# Patient Record
Sex: Female | Born: 1935 | Race: White | Hispanic: No | Smoking: Never smoker
Health system: Southern US, Community
[De-identification: ages and names within clinical notes are randomized; demographics above are authoritative.]

## PROBLEM LIST (undated history)

## (undated) DIAGNOSIS — I509 Heart failure, unspecified: Secondary | ICD-10-CM

## (undated) DIAGNOSIS — D649 Anemia, unspecified: Secondary | ICD-10-CM

## (undated) DIAGNOSIS — E785 Hyperlipidemia, unspecified: Secondary | ICD-10-CM

## (undated) DIAGNOSIS — T827XXA Infection and inflammatory reaction due to other cardiac and vascular devices, implants and grafts, initial encounter: Secondary | ICD-10-CM

## (undated) DIAGNOSIS — I4891 Unspecified atrial fibrillation: Secondary | ICD-10-CM

## (undated) DIAGNOSIS — M1990 Unspecified osteoarthritis, unspecified site: Secondary | ICD-10-CM

## (undated) DIAGNOSIS — F32A Depression, unspecified: Secondary | ICD-10-CM

## (undated) DIAGNOSIS — G709 Myoneural disorder, unspecified: Secondary | ICD-10-CM

## (undated) DIAGNOSIS — B852 Pediculosis, unspecified: Secondary | ICD-10-CM

## (undated) DIAGNOSIS — E2740 Unspecified adrenocortical insufficiency: Secondary | ICD-10-CM

## (undated) DIAGNOSIS — J189 Pneumonia, unspecified organism: Secondary | ICD-10-CM

## (undated) DIAGNOSIS — F329 Major depressive disorder, single episode, unspecified: Secondary | ICD-10-CM

## (undated) DIAGNOSIS — K219 Gastro-esophageal reflux disease without esophagitis: Secondary | ICD-10-CM

## (undated) DIAGNOSIS — I10 Essential (primary) hypertension: Secondary | ICD-10-CM

## (undated) DIAGNOSIS — N2581 Secondary hyperparathyroidism of renal origin: Secondary | ICD-10-CM

## (undated) DIAGNOSIS — C801 Malignant (primary) neoplasm, unspecified: Secondary | ICD-10-CM

## (undated) DIAGNOSIS — Z8679 Personal history of other diseases of the circulatory system: Secondary | ICD-10-CM

## (undated) DIAGNOSIS — N186 End stage renal disease: Secondary | ICD-10-CM

## (undated) HISTORY — PX: WRIST SURGERY: SHX841

## (undated) HISTORY — DX: Hyperlipidemia, unspecified: E78.5

## (undated) HISTORY — DX: Secondary hyperparathyroidism of renal origin: N25.81

## (undated) HISTORY — DX: Pediculosis, unspecified: B85.2

## (undated) HISTORY — DX: End stage renal disease: N18.6

## (undated) HISTORY — PX: ARTERIOVENOUS GRAFT PLACEMENT: SUR1029

## (undated) HISTORY — DX: Essential (primary) hypertension: I10

## (undated) HISTORY — DX: Unspecified atrial fibrillation: I48.91

## (undated) HISTORY — DX: Anemia, unspecified: D64.9

## (undated) HISTORY — DX: Heart failure, unspecified: I50.9

## (undated) HISTORY — DX: Personal history of other diseases of the circulatory system: Z86.79

## (undated) HISTORY — DX: Unspecified adrenocortical insufficiency: E27.40

## (undated) HISTORY — DX: Malignant (primary) neoplasm, unspecified: C80.1

---

## 2005-10-14 ENCOUNTER — Ambulatory Visit: Payer: Self-pay | Admitting: Oncology

## 2005-12-16 ENCOUNTER — Ambulatory Visit: Payer: Self-pay | Admitting: Oncology

## 2006-02-17 ENCOUNTER — Ambulatory Visit: Payer: Self-pay | Admitting: Oncology

## 2006-03-31 ENCOUNTER — Ambulatory Visit: Payer: Self-pay | Admitting: Oncology

## 2006-04-21 ENCOUNTER — Ambulatory Visit: Payer: Self-pay | Admitting: Oncology

## 2006-06-09 ENCOUNTER — Ambulatory Visit: Payer: Self-pay | Admitting: Oncology

## 2006-08-11 ENCOUNTER — Ambulatory Visit: Payer: Self-pay | Admitting: Oncology

## 2006-10-06 ENCOUNTER — Ambulatory Visit: Payer: Self-pay | Admitting: Oncology

## 2006-12-16 ENCOUNTER — Ambulatory Visit: Payer: Self-pay | Admitting: Oncology

## 2007-04-24 ENCOUNTER — Ambulatory Visit: Payer: Self-pay | Admitting: Oncology

## 2008-02-25 ENCOUNTER — Ambulatory Visit: Payer: Self-pay | Admitting: *Deleted

## 2008-03-24 ENCOUNTER — Ambulatory Visit (HOSPITAL_COMMUNITY): Admission: RE | Admit: 2008-03-24 | Discharge: 2008-03-24 | Payer: Self-pay | Admitting: Vascular Surgery

## 2008-03-24 ENCOUNTER — Ambulatory Visit: Payer: Self-pay | Admitting: Vascular Surgery

## 2008-05-22 ENCOUNTER — Ambulatory Visit: Payer: Self-pay | Admitting: Internal Medicine

## 2008-05-22 ENCOUNTER — Inpatient Hospital Stay (HOSPITAL_COMMUNITY): Admission: EM | Admit: 2008-05-22 | Discharge: 2008-05-26 | Payer: Self-pay | Admitting: Internal Medicine

## 2008-05-23 ENCOUNTER — Ambulatory Visit: Payer: Self-pay | Admitting: Vascular Surgery

## 2009-07-07 ENCOUNTER — Inpatient Hospital Stay (HOSPITAL_COMMUNITY): Admission: RE | Admit: 2009-07-07 | Discharge: 2009-07-15 | Payer: Self-pay | Admitting: Orthopedic Surgery

## 2010-03-15 ENCOUNTER — Inpatient Hospital Stay (HOSPITAL_COMMUNITY): Admission: EM | Admit: 2010-03-15 | Discharge: 2010-03-20 | Payer: Self-pay | Admitting: Internal Medicine

## 2010-08-09 ENCOUNTER — Ambulatory Visit (HOSPITAL_COMMUNITY)
Admission: RE | Admit: 2010-08-09 | Discharge: 2010-08-09 | Payer: Self-pay | Source: Home / Self Care | Admitting: Vascular Surgery

## 2010-08-09 ENCOUNTER — Ambulatory Visit: Payer: Self-pay | Admitting: Vascular Surgery

## 2010-09-08 IMAGING — US US RENAL
1 series · 14 of 25 positions shown · non-contrast
Comparison: 03/16/2010

CLINICAL DATA: Renal failure.  UTI.

RENAL / URINARY TRACT ULTRASOUND
TECHNIQUE: Complete ultrasound exam of the kidneys and urinary
bladder was performed.

[Series 1: us renal · 0.33mm/px · 14 of 27 slices shown]
[im 1/27]
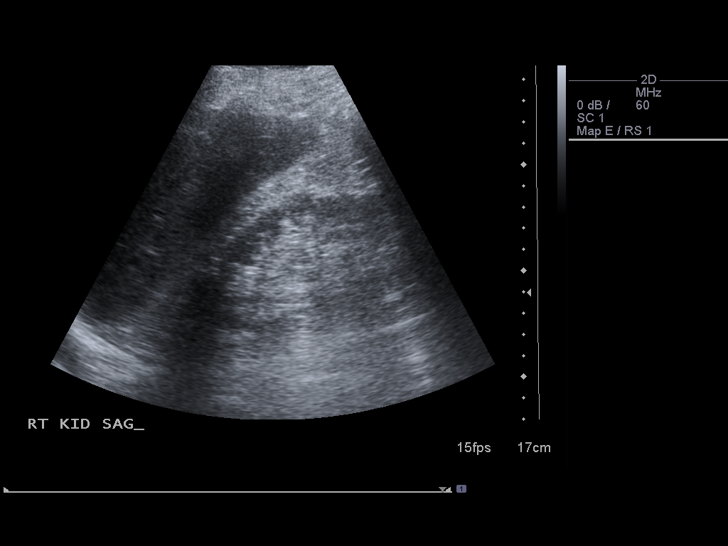
[im 3/27]
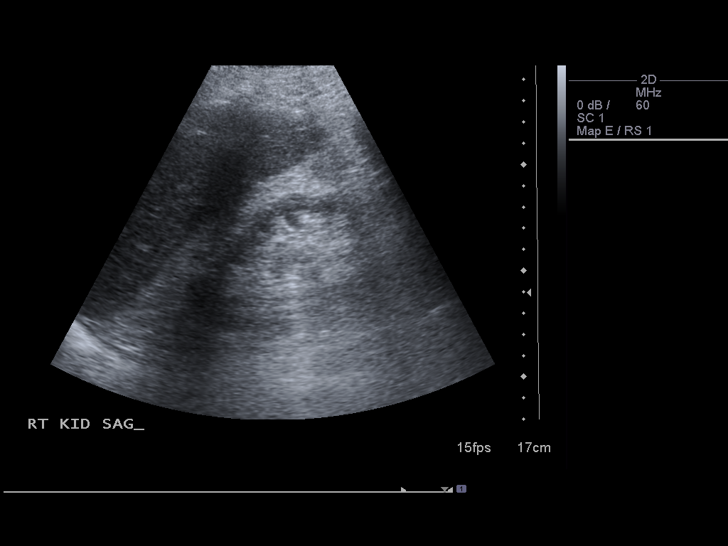
[im 5/27]
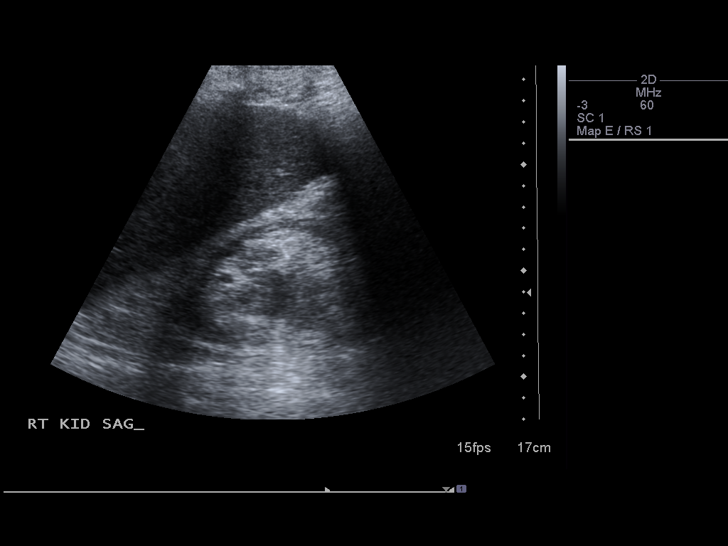
[im 7/27]
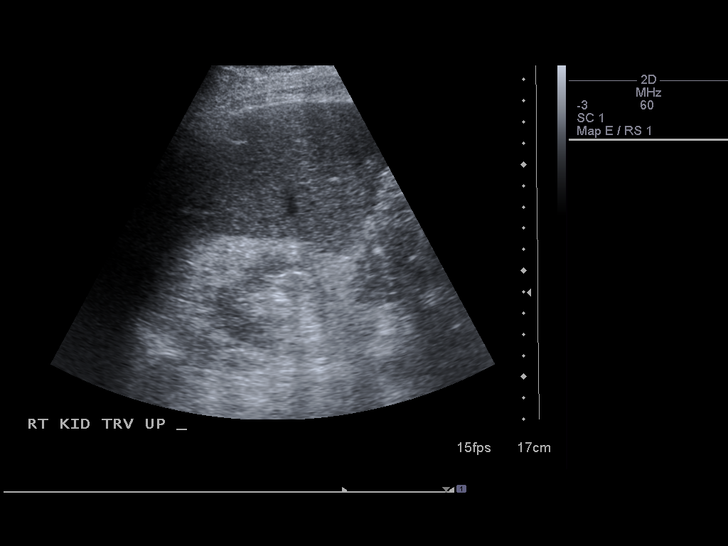
[im 9/27]
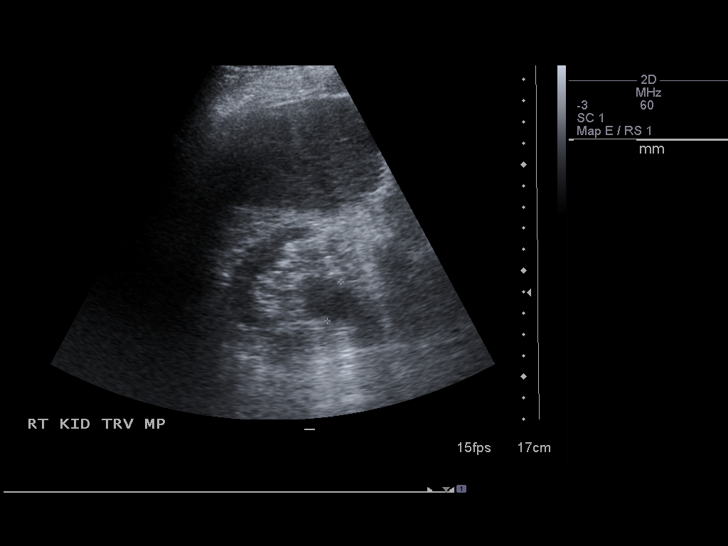
[im 10/27]
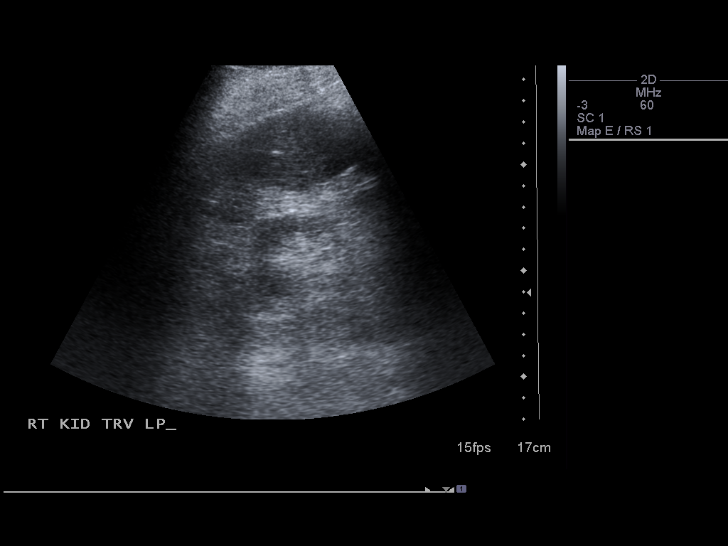
[im 12/27]
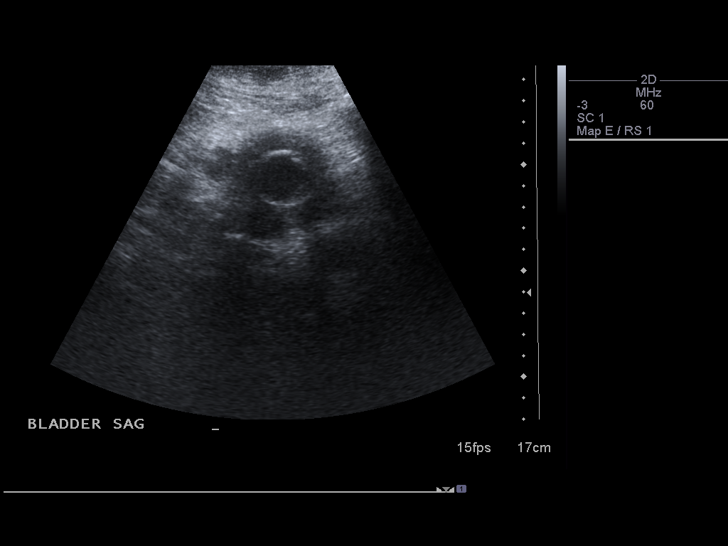
[im 15/27]
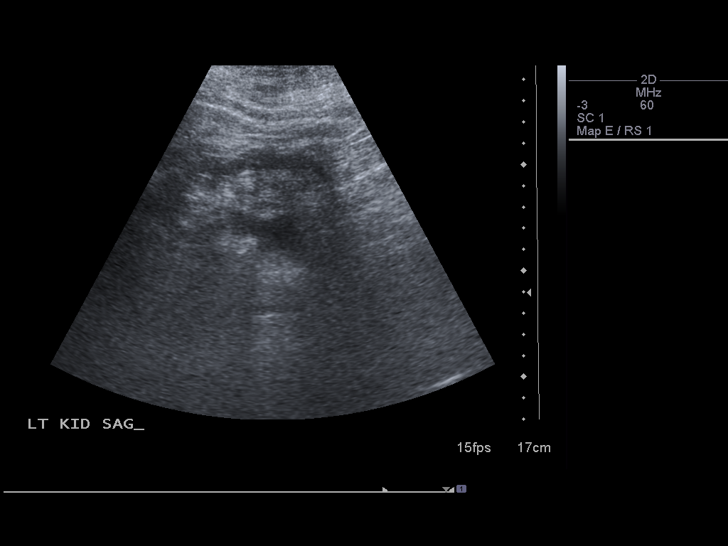
[im 17/27]
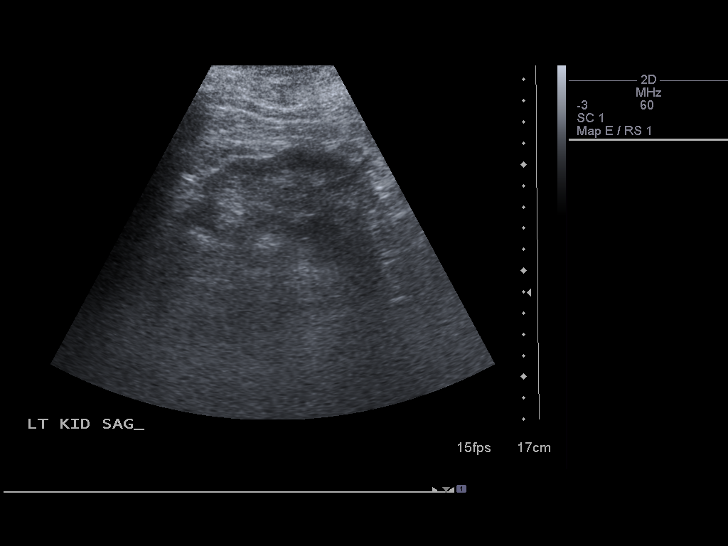
[im 18/27]
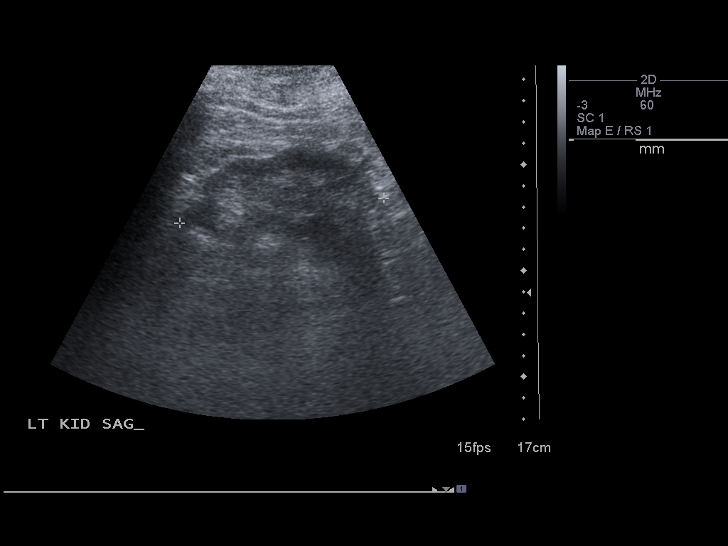
[im 20/27]
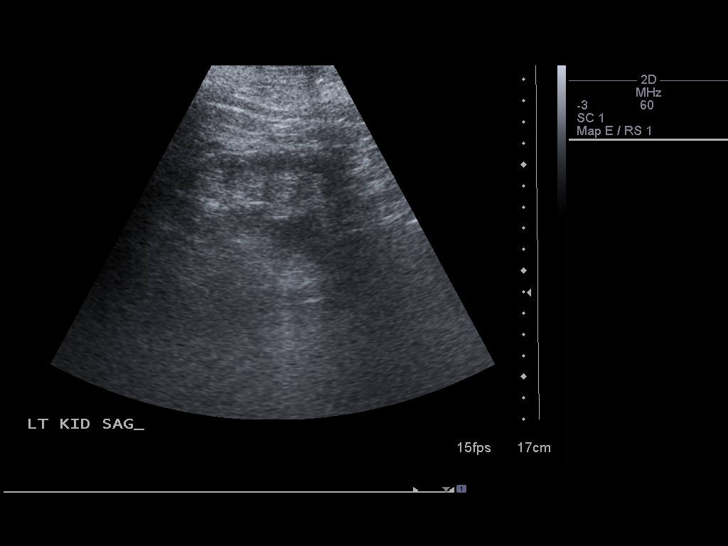
[im 22/27]
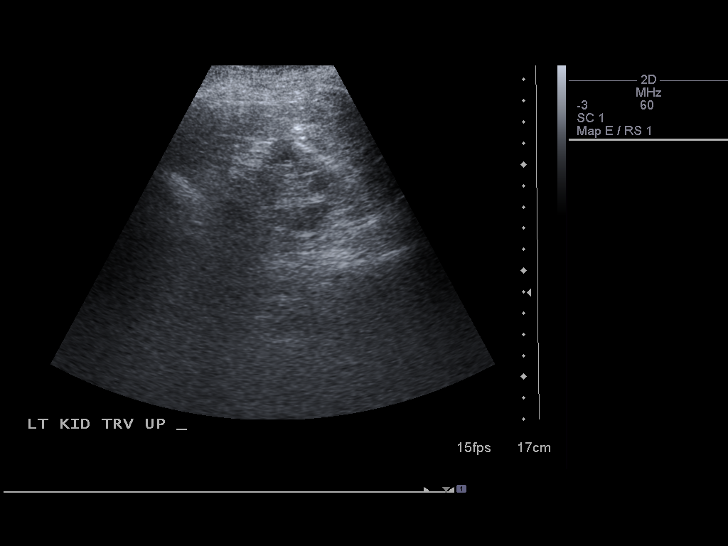
[im 24/27]
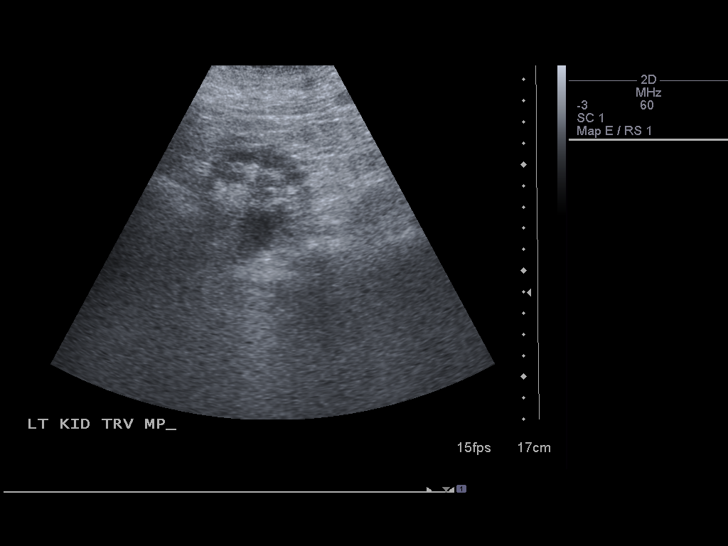
[im 27/27]
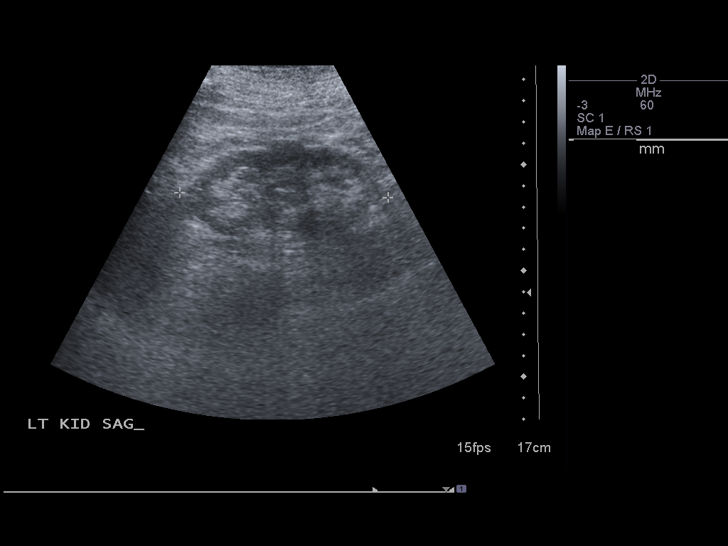

[14 of 25 positions shown; findings below may reference images not displayed]

FINDINGS: The right kidney measures 9.30 cm in long axis.  The left kidney
measures 9.8 cm.  Both kidneys show marked cortical thinning.  The
hydronephrosis seen on the previous study has decreased in the
interval although each renal pelvis remains slightly prominent.

A Foley catheter decompresses the urinary bladder.
IMPRESSION: Interval decrease  in bilateral hydronephrosis.

Bilateral renal cortical thinning.

## 2010-11-20 LAB — GLUCOSE, CAPILLARY: Glucose-Capillary: 145 mg/dL — ABNORMAL HIGH (ref 70–99)

## 2010-11-20 LAB — POCT I-STAT 4, (NA,K, GLUC, HGB,HCT)
Glucose, Bld: 155 mg/dL — ABNORMAL HIGH (ref 70–99)
HCT: 34 % — ABNORMAL LOW (ref 36.0–46.0)
Hemoglobin: 11.6 g/dL — ABNORMAL LOW (ref 12.0–15.0)
Potassium: 4.1 mEq/L (ref 3.5–5.1)

## 2010-11-25 LAB — GLUCOSE, CAPILLARY
Glucose-Capillary: 103 mg/dL — ABNORMAL HIGH (ref 70–99)
Glucose-Capillary: 112 mg/dL — ABNORMAL HIGH (ref 70–99)
Glucose-Capillary: 113 mg/dL — ABNORMAL HIGH (ref 70–99)
Glucose-Capillary: 118 mg/dL — ABNORMAL HIGH (ref 70–99)
Glucose-Capillary: 125 mg/dL — ABNORMAL HIGH (ref 70–99)
Glucose-Capillary: 132 mg/dL — ABNORMAL HIGH (ref 70–99)
Glucose-Capillary: 140 mg/dL — ABNORMAL HIGH (ref 70–99)
Glucose-Capillary: 143 mg/dL — ABNORMAL HIGH (ref 70–99)
Glucose-Capillary: 150 mg/dL — ABNORMAL HIGH (ref 70–99)
Glucose-Capillary: 174 mg/dL — ABNORMAL HIGH (ref 70–99)
Glucose-Capillary: 188 mg/dL — ABNORMAL HIGH (ref 70–99)
Glucose-Capillary: 97 mg/dL (ref 70–99)

## 2010-11-25 LAB — CBC
HCT: 28.8 % — ABNORMAL LOW (ref 36.0–46.0)
HCT: 30.5 % — ABNORMAL LOW (ref 36.0–46.0)
Hemoglobin: 10.3 g/dL — ABNORMAL LOW (ref 12.0–15.0)
Hemoglobin: 9.6 g/dL — ABNORMAL LOW (ref 12.0–15.0)
MCH: 32.7 pg (ref 26.0–34.0)
MCH: 32.7 pg (ref 26.0–34.0)
MCH: 32.8 pg (ref 26.0–34.0)
MCH: 32.8 pg (ref 26.0–34.0)
MCHC: 33.3 g/dL (ref 30.0–36.0)
MCHC: 33.3 g/dL (ref 30.0–36.0)
MCHC: 33.7 g/dL (ref 30.0–36.0)
MCV: 97.3 fL (ref 78.0–100.0)
MCV: 97.9 fL (ref 78.0–100.0)
MCV: 98 fL (ref 78.0–100.0)
MCV: 98.5 fL (ref 78.0–100.0)
Platelets: 170 10*3/uL (ref 150–400)
Platelets: 171 10*3/uL (ref 150–400)
Platelets: 174 10*3/uL (ref 150–400)
Platelets: 220 10*3/uL (ref 150–400)
RBC: 2.92 MIL/uL — ABNORMAL LOW (ref 3.87–5.11)
RBC: 2.93 MIL/uL — ABNORMAL LOW (ref 3.87–5.11)
RBC: 3.14 MIL/uL — ABNORMAL LOW (ref 3.87–5.11)
RDW: 15.5 % (ref 11.5–15.5)
RDW: 15.5 % (ref 11.5–15.5)
RDW: 16 % — ABNORMAL HIGH (ref 11.5–15.5)
WBC: 10.1 10*3/uL (ref 4.0–10.5)
WBC: 6.3 10*3/uL (ref 4.0–10.5)
WBC: 8.8 10*3/uL (ref 4.0–10.5)

## 2010-11-25 LAB — URINE MICROSCOPIC-ADD ON

## 2010-11-25 LAB — RENAL FUNCTION PANEL
BUN: 26 mg/dL — ABNORMAL HIGH (ref 6–23)
BUN: 46 mg/dL — ABNORMAL HIGH (ref 6–23)
CO2: 28 mEq/L (ref 19–32)
Chloride: 96 mEq/L (ref 96–112)
Creatinine, Ser: 3.13 mg/dL — ABNORMAL HIGH (ref 0.4–1.2)
GFR calc non Af Amer: 14 mL/min — ABNORMAL LOW (ref >60)
Glucose, Bld: 116 mg/dL — ABNORMAL HIGH (ref 70–99)
Phosphorus: 4.3 mg/dL (ref 2.3–4.6)
Potassium: 4.2 mEq/L (ref 3.5–5.1)
Potassium: 4.3 mEq/L (ref 3.5–5.1)

## 2010-11-25 LAB — URINALYSIS, ROUTINE W REFLEX MICROSCOPIC
Bilirubin Urine: NEGATIVE
Glucose, UA: NEGATIVE mg/dL
Ketones, ur: 15 mg/dL — AB
Nitrite: POSITIVE — AB
Protein, ur: 100 mg/dL — AB
Specific Gravity, Urine: 1.02 (ref 1.005–1.030)
Urobilinogen, UA: 1 mg/dL (ref 0.0–1.0)
pH: 7 (ref 5.0–8.0)

## 2010-11-25 LAB — HEMOGLOBIN A1C
Hgb A1c MFr Bld: 6.4 % — ABNORMAL HIGH (ref <5.7)
Mean Plasma Glucose: 137 mg/dL — ABNORMAL HIGH (ref <117)

## 2010-11-25 LAB — URINE CULTURE
Colony Count: NO GROWTH
Culture: NO GROWTH

## 2010-11-25 LAB — BASIC METABOLIC PANEL
BUN: 13 mg/dL (ref 6–23)
CO2: 29 mEq/L (ref 19–32)
CO2: 29 mEq/L (ref 19–32)
Calcium: 9 mg/dL (ref 8.4–10.5)
Chloride: 92 mEq/L — ABNORMAL LOW (ref 96–112)
Chloride: 93 mEq/L — ABNORMAL LOW (ref 96–112)
Creatinine, Ser: 2.42 mg/dL — ABNORMAL HIGH (ref 0.4–1.2)
GFR calc Af Amer: 17 mL/min — ABNORMAL LOW (ref >60)
Glucose, Bld: 196 mg/dL — ABNORMAL HIGH (ref 70–99)
Sodium: 131 mEq/L — ABNORMAL LOW (ref 135–145)

## 2010-11-25 LAB — CULTURE, BLOOD (ROUTINE X 2): Culture: NO GROWTH

## 2010-12-12 LAB — CBC
HCT: 30 % — ABNORMAL LOW (ref 36.0–46.0)
HCT: 31.1 % — ABNORMAL LOW (ref 36.0–46.0)
HCT: 33.5 % — ABNORMAL LOW (ref 36.0–46.0)
Hemoglobin: 10.2 g/dL — ABNORMAL LOW (ref 12.0–15.0)
MCHC: 33.5 g/dL (ref 30.0–36.0)
MCHC: 34.1 g/dL (ref 30.0–36.0)
MCV: 100.1 fL — ABNORMAL HIGH (ref 78.0–100.0)
Platelets: 219 10*3/uL (ref 150–400)
Platelets: 230 10*3/uL (ref 150–400)
Platelets: 304 10*3/uL (ref 150–400)
RDW: 13.8 % (ref 11.5–15.5)
RDW: 13.8 % (ref 11.5–15.5)
RDW: 13.9 % (ref 11.5–15.5)
WBC: 6.6 10*3/uL (ref 4.0–10.5)

## 2010-12-12 LAB — COMPREHENSIVE METABOLIC PANEL
AST: 25 U/L (ref 0–37)
Albumin: 2.8 g/dL — ABNORMAL LOW (ref 3.5–5.2)
Alkaline Phosphatase: 86 U/L (ref 39–117)
BUN: 38 mg/dL — ABNORMAL HIGH (ref 6–23)
Chloride: 94 mEq/L — ABNORMAL LOW (ref 96–112)
Creatinine, Ser: 3.68 mg/dL — ABNORMAL HIGH (ref 0.4–1.2)
GFR calc Af Amer: 15 mL/min — ABNORMAL LOW (ref >60)
Potassium: 3.8 mEq/L (ref 3.5–5.1)
Total Bilirubin: 0.5 mg/dL (ref 0.3–1.2)
Total Protein: 6.4 g/dL (ref 6.0–8.3)

## 2010-12-12 LAB — URINE CULTURE
Colony Count: NO GROWTH
Culture: NO GROWTH

## 2010-12-12 LAB — GLUCOSE, CAPILLARY
Glucose-Capillary: 100 mg/dL — ABNORMAL HIGH (ref 70–99)
Glucose-Capillary: 107 mg/dL — ABNORMAL HIGH (ref 70–99)
Glucose-Capillary: 109 mg/dL — ABNORMAL HIGH (ref 70–99)
Glucose-Capillary: 115 mg/dL — ABNORMAL HIGH (ref 70–99)
Glucose-Capillary: 118 mg/dL — ABNORMAL HIGH (ref 70–99)
Glucose-Capillary: 128 mg/dL — ABNORMAL HIGH (ref 70–99)
Glucose-Capillary: 139 mg/dL — ABNORMAL HIGH (ref 70–99)
Glucose-Capillary: 149 mg/dL — ABNORMAL HIGH (ref 70–99)
Glucose-Capillary: 164 mg/dL — ABNORMAL HIGH (ref 70–99)
Glucose-Capillary: 175 mg/dL — ABNORMAL HIGH (ref 70–99)
Glucose-Capillary: 218 mg/dL — ABNORMAL HIGH (ref 70–99)
Glucose-Capillary: 62 mg/dL — ABNORMAL LOW (ref 70–99)
Glucose-Capillary: 68 mg/dL — ABNORMAL LOW (ref 70–99)
Glucose-Capillary: 75 mg/dL (ref 70–99)
Glucose-Capillary: 83 mg/dL (ref 70–99)
Glucose-Capillary: 98 mg/dL (ref 70–99)

## 2010-12-12 LAB — URINALYSIS, ROUTINE W REFLEX MICROSCOPIC
Bilirubin Urine: NEGATIVE
Ketones, ur: NEGATIVE mg/dL
Nitrite: NEGATIVE
Protein, ur: 30 mg/dL — AB
Urobilinogen, UA: 0.2 mg/dL (ref 0.0–1.0)
pH: 6 (ref 5.0–8.0)

## 2010-12-12 LAB — IRON AND TIBC
Saturation Ratios: 9 % — ABNORMAL LOW (ref 20–55)
UIBC: 173 ug/dL

## 2010-12-12 LAB — RENAL FUNCTION PANEL
Albumin: 2.8 g/dL — ABNORMAL LOW (ref 3.5–5.2)
Albumin: 2.9 g/dL — ABNORMAL LOW (ref 3.5–5.2)
BUN: 27 mg/dL — ABNORMAL HIGH (ref 6–23)
BUN: 63 mg/dL — ABNORMAL HIGH (ref 6–23)
Calcium: 8.2 mg/dL — ABNORMAL LOW (ref 8.4–10.5)
Calcium: 8.7 mg/dL (ref 8.4–10.5)
Chloride: 94 mEq/L — ABNORMAL LOW (ref 96–112)
Creatinine, Ser: 3.06 mg/dL — ABNORMAL HIGH (ref 0.4–1.2)
Glucose, Bld: 135 mg/dL — ABNORMAL HIGH (ref 70–99)
Phosphorus: 3.7 mg/dL (ref 2.3–4.6)
Phosphorus: 6 mg/dL — ABNORMAL HIGH (ref 2.3–4.6)
Potassium: 4.2 mEq/L (ref 3.5–5.1)

## 2010-12-12 LAB — TSH: TSH: 5.676 u[IU]/mL — ABNORMAL HIGH (ref 0.350–4.500)

## 2010-12-12 LAB — URINALYSIS, MICROSCOPIC ONLY
Nitrite: NEGATIVE
Protein, ur: 30 mg/dL — AB
Urobilinogen, UA: 0.2 mg/dL (ref 0.0–1.0)

## 2010-12-12 LAB — OSMOLALITY: Osmolality: 288 mOsm/kg (ref 275–300)

## 2010-12-12 LAB — VITAMIN B12: Vitamin B-12: 1124 pg/mL — ABNORMAL HIGH (ref 211–911)

## 2010-12-12 LAB — URINE MICROSCOPIC-ADD ON

## 2010-12-12 LAB — OSMOLALITY, URINE: Osmolality, Ur: 274 mOsm/kg — ABNORMAL LOW (ref 390–1090)

## 2010-12-12 LAB — RETICULOCYTES: Retic Ct Pct: 1.4 % (ref 0.4–3.1)

## 2010-12-13 LAB — URINE CULTURE: Culture: NO GROWTH

## 2010-12-13 LAB — BASIC METABOLIC PANEL
BUN: 41 mg/dL — ABNORMAL HIGH (ref 6–23)
BUN: 50 mg/dL — ABNORMAL HIGH (ref 6–23)
CO2: 26 mEq/L (ref 19–32)
Calcium: 8.6 mg/dL (ref 8.4–10.5)
Chloride: 91 mEq/L — ABNORMAL LOW (ref 96–112)
Chloride: 95 mEq/L — ABNORMAL LOW (ref 96–112)
Creatinine, Ser: 3.34 mg/dL — ABNORMAL HIGH (ref 0.4–1.2)
Creatinine, Ser: 3.77 mg/dL — ABNORMAL HIGH (ref 0.4–1.2)
Creatinine, Ser: 3.83 mg/dL — ABNORMAL HIGH (ref 0.4–1.2)
GFR calc Af Amer: 14 mL/min — ABNORMAL LOW (ref >60)
GFR calc non Af Amer: 11 mL/min — ABNORMAL LOW (ref >60)
Glucose, Bld: 147 mg/dL — ABNORMAL HIGH (ref 70–99)
Glucose, Bld: 177 mg/dL — ABNORMAL HIGH (ref 70–99)
Potassium: 4.9 mEq/L (ref 3.5–5.1)

## 2010-12-13 LAB — URINALYSIS, MICROSCOPIC ONLY
Bilirubin Urine: NEGATIVE
Ketones, ur: NEGATIVE mg/dL
Nitrite: NEGATIVE
Urobilinogen, UA: 0.2 mg/dL (ref 0.0–1.0)

## 2010-12-13 LAB — TYPE AND SCREEN: ABO/RH(D): A NEG

## 2010-12-13 LAB — CBC
MCHC: 34.1 g/dL (ref 30.0–36.0)
MCV: 100.1 fL — ABNORMAL HIGH (ref 78.0–100.0)
Platelets: 236 10*3/uL (ref 150–400)
WBC: 8 10*3/uL (ref 4.0–10.5)

## 2010-12-13 LAB — GLUCOSE, CAPILLARY
Glucose-Capillary: 104 mg/dL — ABNORMAL HIGH (ref 70–99)
Glucose-Capillary: 117 mg/dL — ABNORMAL HIGH (ref 70–99)
Glucose-Capillary: 121 mg/dL — ABNORMAL HIGH (ref 70–99)
Glucose-Capillary: 151 mg/dL — ABNORMAL HIGH (ref 70–99)
Glucose-Capillary: 152 mg/dL — ABNORMAL HIGH (ref 70–99)
Glucose-Capillary: 64 mg/dL — ABNORMAL LOW (ref 70–99)
Glucose-Capillary: 67 mg/dL — ABNORMAL LOW (ref 70–99)

## 2011-01-22 NOTE — Consult Note (Signed)
VASCULAR SURGERY CONSULTATION   [name], [name]  DOB:  [birth year]                                       02/25/2008  ZOXWR#:[id number]   REFERRING PHYSICIAN:  Garnetta Buddy, M.D.   PRIMARY CARE PHYSICIAN:  Dr. Durward Parcel.   REASON FOR CONSULTATION:  End-stage renal failure, chronic kidney  disease.   HISTORY:  The patient is a 77 year old female referred for placement of  hemodialysis access.  She has a history of malignant hypertension.  She  has been hypertensive most of her life.  She is referred at this time  for placement of hemodialysis access.  Note her BUN and creatinine from  April of this year are 72 and 3.26 respectively.   PAST MEDICAL HISTORY:  1. Hypertension.  2. Type 2 diabetes.  3. Atrial fibrillation.  4. Adrenal insufficiency.  5. Atonic bladder  6. Chronic kidney disease.   MEDICATIONS:  1. Humulin N.  2. Amlodipine 10 mg daily.  3. Furosemide 240 mg b.i.d.  4. Carvedilol 25 mg one and a half tablets b.i.d.  5. Amitriptyline 100 mg daily at bedtime.  6. Nitrofurantoin 100 mg daily at bedtime.  7. Plavix 75 mg daily.  8. Bethanechol 25 mg b.i.d.  9. Metolazone 5 mg p.r.n.  10.Simvastatin 40 mg daily.  11.Fludrocortisone 0.1 mg daily.  12.Hydrocortisone 20 mg daily.  13.Cipro 500 mg daily.  14.Protonix 40 mg daily.   ALLERGIES:  Allergies to codeine.   SOCIAL HISTORY:  The patient is widowed.  Two grown children.  Not a  smoker or alcohol user.   REVIEW OF SYSTEMS:  This was reviewed today.  Refer to patient encounter  form.  Positive findings include shortness of breath with exertion.  Pain in the legs when lying flat and walking.  Joint and arthritic  discomfort.  Chronic anemia.   PHYSICAL EXAMINATION:  General:  [name].  Alert and  oriented.  No acute distress.  Moderate obesity.  Vital signs:  BP  107/70, pulse 90 per minute, respirations 18 per minute.  HEENT:  Mouth  and throat are clear.   Normocephalic.  Extraocular movements intact.  Neck:  Supple.  No thyromegaly or adenopathy.  Cardiovascular:  Normal  heart sounds without murmurs.  No carotid bruits.  Chest:  Equal entry  bilaterally.  No crackles.  Abdomen:  Soft, nontender.  No masses or  organomegaly.  Extremities:  1 to 2+ edema.  2+ left brachial and radial  pulses.   IMPRESSION:  1. Chronic kidney disease stage IV.  2. Hypertension.  3. Type 2 diabetes.  4. Atrial fibrillation.  5. Renal insufficiency.  6. Atonic bladder.   PLAN:  The patient will be scheduled for placement of a dialysis  catheter and left arm AV graft 03/02/2008 at [facility].   Balinda Quails, M.D.  Electronically Signed  PGH/MEDQ  D:  02/25/2008  T:  02/25/2008  Job:  [id number]   cc:   Garnetta Buddy, M.D.  Domenica Fail

## 2011-01-22 NOTE — Op Note (Signed)
NAME:  [name], [name]              ACCOUNT NO.:  [id number]   MEDICAL RECORD NO.:  [id number]          PATIENT TYPE:  AMB   LOCATION:  SDS                          FACILITY:  [facility]   PHYSICIAN:  Larina Earthly, M.D.    DATE OF BIRTH:  09/06/1930   DATE OF PROCEDURE:  DATE OF DISCHARGE:                               OPERATIVE REPORT   PREOPERATIVE DIAGNOSIS:  Chronic renal insufficiency.   POSTOPERATIVE DIAGNOSIS:  Chronic renal insufficiency.   PROCEDURE:  1. Placement of right internal jugular vein Diatek catheter with      ultrasound visualization.  2. Placement of left forearm loop 4-7 mm taper graft to basilic vein,      AV Gore-Tex graft.   SURGEON:  Dr. Tawanna Cooler Early.   ASSISTANT:  Nurse.   ANESTHESIA:  MAC.   COMPLICATIONS:  None.   DISPOSITION:  To recovery room in stable.   PROCEDURE IN DETAIL:  The patient was taken to the operating room and  placed in supine position.  The area of the left arm was prepped and  draped in sterile fashion.  Imaging with ultrasound revealed patent  basilic vein, very small sclerotic cephalic vein, and small brachial  veins.  The incision was made over the basilic vein just above the  elbow, the vein was of moderate size.  Next, a separate incision was  made at the antecubital space over the brachial pulse and the artery  also was small size.  A separate incision was made over the distal  forearm and a loop configuration tunnel was created.  Due to the small  size of her brachial artery, decision was made to place a 4-7 tapered  graft.  The graft was placed through the tunnel and the brachial artery  was clipped proximally and distally with Christopher clamps.  The  brachial artery was opened with 11 blade and extended longitudinally  using Potts scissors.  The artery was generally dilated and the graft  was cut in appropriate length and was sewn end-to-side with the 4-mm  segment of the graft sewn to the brachial artery.  Clamps were  removed  and good flow was noted through the graft.  The graft was flushed with  half-normal saline and reoccluded.  Next, the basilic vein was occluded  proximally and distally and was opened with 11 blade extending  longitudinally using Potts scissors.  The graft was spatulated and sewn  end-to-side to the vein with a running 6-0 Prolene suture.  Clamps were  removed and good flow was noted.  The wounds were irrigated with saline.  Hemostasis with electrocautery.  Wounds were closed with 3-0 Vicryl in  the subcutaneous and subcuticular tissue.  The patient had extremely  fragile skin, and therefore, Steri-Strips were not used and Kerlix  dressing was placed.  Next, attention was turned to the right and left  neck, which were imaged with ultrasound revealing widely patent right  jugular vein.  The patient was placed in Trendelenburg position.  The  right and left neck and chest were prepped and draped in usual sterile  fashion.  Using local anesthesia and a finer needle, the right internal  jugular vein was easily accessed.  Next, using a Seldinger technique, a  guidewire was passed down to the level of right atrium.  This was  confirmed with fluoroscopy.  A dilator and peel-away sheath was passed  over the guidewire and the dilator and guidewire were removed.  A 28-cm  Diatek catheter was passed through the peel-away sheath and the peel-  away sheath was removed.  Catheter tips were positioned at the level of  the right atrium.  Catheter was brought through the subcutaneous tunnel  through a separate stab incision using local anesthesia.  The 2-lumen  ports were attached, both lumens flushed and aspirated easily, and were  locked with 1000 units/mL heparin.  The catheter was secured to skin  with 3-0 nylon stitch and the entry site was closed with a 4-0  subcuticular Vicryl stitch.  Sterile dressing was applied.  The patient  was taken to the recovery room where chest x-ray was  obtained.      Larina Earthly, M.D.  Electronically Signed     TFE/MEDQ  D:  03/24/2008  T:  03/25/2008  Job:  [id number]

## 2011-01-22 NOTE — Op Note (Signed)
[name], [name]              ACCOUNT NO.:  [id number]   MEDICAL RECORD NO.:  [id number]          PATIENT TYPE:  INP   LOCATION:  2902                         FACILITY:  [facility]   PHYSICIAN:  Janetta Hora. Fields, MD  DATE OF BIRTH:  1930/04/27   DATE OF PROCEDURE:  05/23/2008  DATE OF DISCHARGE:                               OPERATIVE REPORT   PROCEDURE:  Simple thrombectomy, left forearm arteriovenous graft.   PREOPERATIVE DIAGNOSIS:  Thrombosed arteriovenous graft, left arm.   POSTOPERATIVE DIAGNOSIS:  Thrombosed arteriovenous graft, left arm.   ANESTHESIA:  Local with IV sedation.   ASSISTANT:  Jess Barters, RNFA   OPERATIVE FINDINGS:  No venous narrowing.   OPERATIVE DETAILS:  After obtaining informed consent, the patient was  taken to the operating room.  The patient was placed in supine position  on the operating table.  After adequate sedation, the patient's entire  left upper extremity was prepped and draped in the usual sterile  fashion.  Local anesthesia was infiltrated at a preexisting longitudinal  scar near the antecubital crease.  Incision was made in this location  and carried down through subcutaneous tissues down to the level of  graft.  Graft was dissected free circumferentially.  Previous  anastomosis had been end-to-side.  The vein was dissected free proximal  and distal to the anastomosis.  The patient was then given 5000 units of  intravenous heparin.  A transverse graftotomy was made just above the  level of venous anastomosis.  A #4 Fogarty catheter was used to  thrombectomize the venous limb of the graft.  All thrombotic material  was removed and there was good venous back bled at this point.  This was  clamped with a fine bulldog clamp.  Anastomosis was then probed and  found to easily accept 3, 4, and 5 coronary dilators.  The vein was  approximately 3 mm in diameter.  It was thought there was no significant  narrowing of the venous anastomosis.   This was then reoccluded with a  fine bulldog clamp.  Arterial limb of the graft was then thrombectomized  with #4 Fogarty catheter.  This was thrombectomized with one pass.  An  arterialized plug was retrieved and there was excellent arterial inflow.  This was then clamped proximally.  Next, everything was thoroughly  irrigated with heparinized saline.  The graftotomy was reapproximated  using running 6-0 Prolene suture.  Just prior to completion of  anastomosis, fore bleed and back bled and thoroughly flushed.  Anastomosis was secured.  Clamps were released.  There was a weakly  palpable thrill above the graft immediately.  Next, subcutaneous tissues  were reapproximated using running 3-0 Vicryl suture.  Skin was closed  with a 4-0 Vicryl subcuticular stitch.  The patient tolerated the  procedure well, and there were no complications.  Instrument, sponge,  and needle counts were correct at the end of  the case.  The patient was taken to the recovery room in stable  condition.  It was though that most likely the graft occluded with  hypotension as the patient had been admitted  with a syncopal episode and  bradycardia.  There was no obvious narrowing of the arterial and venous  limbs.      Janetta Hora. Fields, MD  Electronically Signed     CEF/MEDQ  D:  05/23/2008  T:  05/24/2008  Job:  [id number]

## 2011-01-22 NOTE — Consult Note (Signed)
NAMEJENNIFIER, [name] NO.:  [id number]   MEDICAL RECORD NO.:  [id number]          PATIENT TYPE:  INP   LOCATION:  2001                         FACILITY:  [facility]   PHYSICIAN:  Jake Bathe, MD      DATE OF BIRTH:  02-Sep-1930   DATE OF CONSULTATION:  05/24/2008  DATE OF DISCHARGE:                                 CONSULTATION   REFERRING PHYSICIAN:  Alvester Morin, MD   PRIMARY CARDIOLOGIST:  Baldo Daub, MD,    REASON FOR CONSULTATION:  [name] is being seen at the request of  Dr. Harvie Junior for the evaluation of bradycardia in the setting of atrial  fibrillation.   HISTORY OF PRESENT ILLNESS:  [name] known well to Dr.  Dulce Sellar in [place] who was transferred to [facility] for further  evaluation of severe bradycardia in the setting of hyperkalemia and need  for emergent dialysis.  [name] potassium upon transfer on May 22, 2008 was area 7.1.  In that setting, [name] had symptomatic bradycardia  into the 30s twice.  [name] was at that time on both digoxin and verapamil.  Review of the ECG from [facility] showed Jacqueline ventricular rate of 38  with underlying atrial fibrillation in Jacqueline fairly regular pattern  suggestive of junctional/ventricular escape.  Currently, the ECG and  telemetry both demonstrate atrial fibrillation with heart rate in the  100s-110s.   Currently, [name] is resting comfortably in bed following dialysis and  sitting with [name] son.  [name] son states that during this period of  bradycardia, [name] was confused but did not pass out.   In regards to Coumadin use, [name] does not take this pill secondary to it  tears up my stomach.  [name] is aware of increased stroke risk.   PAST MEDICAL HISTORY:  1. Atrial fibrillation, persistent, followed by Dr. Dulce Sellar with recent      use of digoxin and verapamil.  Dr. Hulen Shouts last digoxin level was      0.8 on May 12, 2008.  2. Sinoatrial node dysfunction/tachycardia-bradycardia  syndrome.  3. Diabetes.  4. Hypertension.  5. Anemia.  6. Patent foramen ovale with pulmonary hypertension.  7. Chronic kidney disease, on hemodialysis.  8. Diastolic dysfunction.   ALLERGIES:  CODEINE, ASPIRIN, NIFEDIPINE, VASOTEC, OXYCODONE, and  LEVAQUIN.   MEDICATIONS AT HOME:  [name] was taking verapamil 80 mg twice Jacqueline day,  carvedilol 12.5 mg twice Jacqueline day, and digoxin Monday, Wednesday, and  Friday 125 mcg.  [name] is also taking simvastatin 40 mg Jacqueline day, Protonix 40  mg Jacqueline day, sulfamethoxazole with hemodialysis, and insulin Humulin 47  units Jacqueline day.   SOCIAL HISTORY:  [name] denies any tobacco, alcohol, or illicit drug use.  [name] lives alone, but [name] son lives nearby.   FAMILY HISTORY:  Currently noncontributory.   REVIEW OF SYSTEMS:  [name] did originally describe some chest discomfort,  substernal, dull, not related to exertion but is currently chest pain  free.  Cardiac biomarkers were all normal.  Unless specified above, all  other 12 review of systems were negative.  PHYSICAL EXAMINATION:  VITAL SIGNS:  Pulse currently 110 with atrial  fibrillation, blood pressure 161/51, saturating 93% on room air, and  afebrile.  GENERAL:  Alert and oriented x3, comfortable in bed, hoarse speaking  voice after dialysis.  EYES:  Pale conjunctivae.  EOMI.  No scleral icterus.  NECK:  No JVD.  No carotid bruits appreciated.  Normal carotid  upstrokes.  CARDIOVASCULAR:  Irregularly irregular rhythm, tachycardic with systolic  murmur heard at right upper sternal border, soft, and normal-appearing  PMI.  LUNGS:  Clear to auscultation bilaterally without any appreciable  wheezes or rales.  Normal respiratory effort.  ABDOMEN:  Soft, nontender, and normoactive bowel sounds.  No bruits  appreciated.  EXTREMITIES:  No lower extremity edema or varicosities noted.  Difficult  to palpate distal pulses.  NEUROLOGIC:  Nonfocal.  No tremors.  SKIN:  Warm, dry, and intact.  No rashes.   LABORATORY DATA:   Cardiac biomarkers negative x3.  Recent CBC, white  count 9.1, hemoglobin 9.6, hematocrit 30.4, and platelets 408.  Blood  cultures x2 are no growth to date.  Potassium currently is 4.2.  TSH was  3.4 within normal limits and hemoglobin A1c was 6.7.  [name] digoxin level  was 0.3 upon arrival here.  EKG as described above.  Chest x-ray shows  persistent small bilateral effusions, but no new abnormalities,  personally viewed.   ASSESSMENT AND PLAN:  [name] with paroxysmal/currently  persistent atrial fibrillation with chronic kidney disease on  hemodialysis with recent episode of severe hyperkalemia, potassium 7.1  with subsequent bradycardia, and heart rate 38 with altered mental  status.  1. Sinoatrial node dysfunction/tachycardia-bradycardia syndrome.      Agree with current plan of most likely exacerbating etiology to [name]      bradycardia was [name] profound electrolyte disorder of potassium 7.1      in conjunction with [name] verapamil and digoxin use.  I completely      agree with discontinuation of these 2 agents.  Currently, Coreg or      carvedilol 6.25 mg twice Jacqueline day has been restarted and [name] heart      rate as I examine [name] is between 100 and 114 in atrial      fibrillation.  I would like to optimally keep [name] heart rate below      100 and would suggest increasing [name] carvedilol back to 12.5 twice      Jacqueline day as [name] was on previously.  I would try to avoid digoxin given      [name] hemodialysis status and I would also avoid for verapamil.  If      [name] once again demonstrates severe bradycardia with carvedilol      usage, [name] will likely require permanent pacemaker to protect [name]      from severe bradycardia.  Another possibility is if we are unable      to rate control [name] on carvedilol, then further consultation with      Electrophysiology for possible AV node ablation and permanent      pacemaker is Jacqueline possibility.  My guess is that the electrolyte      abnormality is  certainly playing Jacqueline significant role in [name]      bradycardia.  2. End-stage renal disease - hemodialysis, potassium corrected.  3. Stroke prevention - I discussed with [name] Coumadin use for stroke      prevention.  [name] is not able to take this secondary to it  tears my      stomach up.  [name] does understand the increased risk of stroke      without Coumadin usage.  [name] is currently on Plavix.  [name] also has      an intolerance to ASPIRIN.  4. Diabetes - hemoglobin A1c 6.7, stable.  5. Hypertension - restarting Coreg mildly elevated today.  6. Recent urinary tract infection, Septra started.  7. History of adrenal insufficiency - [name] is currently on      hydrocortisone.  Certainly in the setting of an addisonian      crisis, hyponatremia and hyperkalemia can develop and [name] must      maintain [name] current mineralocorticoid/steroid usage.   We will follow along with you.  Thank you for this consultation.      Jake Bathe, MD  Electronically Signed     MCS/MEDQ  D:  05/24/2008  T:  05/25/2008  Job:  [id number]

## 2011-01-25 NOTE — Discharge Summary (Signed)
NAMESOLARIS, KRAM NO.:  [id number]   MEDICAL RECORD NO.:  [id number]          PATIENT TYPE:  INP   LOCATION:  2001                         FACILITY:  [facility]   PHYSICIAN:  Alvester Morin, M.D.  DATE OF BIRTH:  May 31, 1930   DATE OF ADMISSION:  05/22/2008  DATE OF DISCHARGE:  05/26/2008                               DISCHARGE SUMMARY   ATTENDING PHYSICIAN:  Alvester Morin, MD, Internal Medicine.   DISCHARGE DIAGNOSES:  1. Presyncope - secondary to bradycardia due to combination of      verapamil, carvedilol, and digoxin, resolved  2. Atrial fibrillation - chronic, followed by cardiologist in      [place], not on Coumadin.  3. Hyperkalemia - likely secondary to end-stage renal disease,      resolved  4. Hyponatremia - chronic, secondary to adrenal insufficiency, stable      at 129-132.  5. End-stage renal disease - on hemodialysis Monday, Wednesday, and      Friday, started in July 2009, continuing in [facility].  6. Urinary tract infection - the patient was admitted with urinary      tract infection diagnosis made at [facility], resolved on      discharge.  7. Diabetes type 2.  8. Anemia - secondary to chronic renal disease, baseline hemoglobin      equals 10.  9. Adrenal insufficiency, the patient on hydrocortisone treatment.   DISCHARGE MEDICATIONS:  1. Bethanechol 25 mg twice daily.  2. Coreg 6.25 mg twice daily.  3. Plavix 75 mg daily.  4. Hydrocortisone 20 mg daily.  5. Zocor 40 mg daily.  6. Humulin N 10 units at bedtime.  7. Pantoprazole 40 mg daily.  8. Amitriptyline 100 mg once daily at night time.  9. Tramadol 50 mg 1-2 tablets daily as needed for pain.  10.Bactrim DS 1 tablet once daily for 2 days.   Hemodialysis regimen consists of following medications:  Nephro tablet  once daily, Zemplar, Venofer, receiving Monday, Wednesday, and Friday.   DISPOSITION AND FOLLOWUP:  The patient was discharged in stable  condition with  AFib, asymptomatic, no lightheadedness, no dizziness, no  palpitations, no syncopal episodes.  She will follow up with her  cardiologist, Dr. Dulce Sellar in [place] and will continue hemodialysis in  [place] as well Monday, Wednesday, and Friday.  She was discharged with  mild hyponatremia at 129 which is a chronic problem for her, stable in  range of 129-132, also discharged with a creatinine of 2.19 (on  admission 3.5, improved after hemodialysis).  She has received 2 courses  of hemodialysis at [facility].  On followup appointment, it  will be important to check if current medication regimen is suitable for  the patient and if the patient continued to stay asymptomatic with no  syncopal episodes.  Records will be sent to Dr. Dulce Sellar in [place].   PROCEDURES PERFORMED:  On May 23, 2008, left arm arteriovenous  graft thrombectomy performed by Dr. Arbie Cookey (to provide hemodialysis  access).  On May 22, 2008, chest x-ray, mild diffuse interstitial  edema, cardiac enlargement.  Chest  x-ray on May 23, 2008,  resolution of interstitial pulmonary edema, no new abnormalities.   CONSULTANTS:  1. Janetta Hora. Fields, MD, Vascular Surgery.  2. Loraine Leriche C. Anne Fu, MD, Cardiology.  3. Nephrology for hemodialysis.   HISTORY AND PHYSICAL:  The patient is a 77 year old pleasant lady with  past medical history of end-stage renal disease, diabetes, AFib, adrenal  insufficiency.  She presented to [facility] (referred from [facility]) for management of bradycardia in addition to continuing  hemodialysis.  The patient was recently started on hemodialysis in July  and was also recently placed on digoxin (1 week ago), she was  experiencing palpitations and she felt like she could not tolerate the  drug.  Digoxin was stopped, and the patient was started on verapamil 3  days ago.  The patient could not tolerate the medication, at this time  felt like her heart rate was too slow.  This  morning, she presented to  [facility] with generalized weakness and near loss of  consciousness, dizziness, and reports of feeling like her heart was  beating too slow, she almost fell down but did not hit herself since her  son helped her up.  She also reports an episode of substernal chest  tightness lasting for a few seconds, slightly radiating to her left arm,  no specific aggravating factors.  She denies shortness of breath, no  nausea, vomiting, no abdominal problems, no fever or chills, no urinary  incontinence, no seizure-like activity.  She does report increased  urinary frequency, urgency, dribbling; however, no hematuria, no  dysuria, no suprapubic tenderness.   LABORATORY DATA:  White blood cells 11.9, hemoglobin 10.1, platelets 424  (no differential with CBC).  Sodium 129, potassium 5.1, chloride 93,  bicarb 26, BUN 38, creatinine 3.47, glucose 207.   PHYSICAL EXAMINATION:  VITALS:  Temperature 98.2, blood pressure 145/49,  heart rate 60-79 beats per minute, respirations 16, saturating 99% on 4  L.  GENERAL:  The patient lying in bed, does not appear to be in acute  distress.  HEENT:  Head normocephalic, atraumatic, EOMI, no scleral icterus, no  conjunctival pallor.  No sinus tenderness.  No pharyngeal erythema.  NECK:  Supple.  No thyroid enlargement.  No stiffness.  LUNGS:  Clear to auscultation bilaterally.  No wheezes.  CARDIOVASCULAR SYSTEM:  Irregular rate and rhythm.  Systolic murmur 2/6  at right sternal border.  No JVD.  No carotid bruits.  ABDOMEN:  Nondistended, nontender, bowel sounds present, soft, no  guarding.  EXTREMITIES:  Warm, varicose veins bilaterally, moves all four  extremities equally well.  No edema.  No cyanosis.  NEUROLOGIC:  Alert and oriented x3.  No neurologic focal deficits,  strength 5/5 in upper and lower extremities bilaterally, sensation  intact to soft touch and position, normal heel-to-shin, normal finger-to-  nose.    HOSPITAL COURSE:  1. Presyncope secondary to bradycardia (while in [facility]      this morning, the patient had hyperkalemia of 7.1 and on EKG      junctional rhythm at 38 beats per minute, she received treatment      with atropine, calcium gluconate, insulin, sodium bicarb,      Kayexalate).  When she arrived to [facility], potassium equals 5.1,      and she denied symptoms of dizziness, lightheadedness,      palpitations, or shortness of breath.  The symptoms the patient      experienced when admitted to [facility] were  likely caused      by combination of verapamil, carvedilol, and digoxin medications.      In addition, the patient has chronic AFib and is not on Coumadin      for anticoagulation.  The patient is only on Plavix.  We continued      treatment with carvedilol alone and stopped verapamil and digoxin.      Cardiology was consulted and Dr. Donato Schultz agreed with our plan      to continue carvedilol and also agreed with [place] that the patient      might need a pacemaker in the near future.  MI as a possible cause      of presyncope was ruled out by cardiac enzymes x3 negative, EKG      (showed atrial fibrillation with heart rate at 63 beats per minute,      low-voltage QRS complexes, no ST/T-wave abnormalities), 2-D echo 3      months ago showed ejection fraction of 68%.  TSH was within normal      limits (3.4).  During the hospitalization, the patient was      ambulatory, did not report any dizziness, lightheadedness,      palpitations, and did not have any syncopal episodes.  2. Atrial fibrillation.  This is a chronic problem for the patient,      and currently the patient is only on Plavix.  We continued      treatment with Plavix during the hospitalization.  We have      continued Carvedilol and heart rate remained between 60-90 beats      per minute.  The patient was asymptomatic and was stable on      discharge.  3. Hyperkalemia.  This is likely secondary  to end-stage renal disease      treatment with Kayexalate was continued for one more day.      Potassium came down to 4.0.  On day 3, mild hypokalemia developed.      The patient, however, given potassium and potassium remained stable      at around 4.0.  The patient discharged with potassium of 4.6.  4. Hyponatremia.  This is also a chronic problem and was likely      aggravated in the setting of addisonian crisis (hyperkalemia and      hyponatremia can develop).  We will place the patient on fluid      restriction and monitor her labs daily.  We also continued      hydrocortisone treatment.  The patient's baseline sodium is between      129 and 132, remained stable during the hospitalization in low      130s.  5. End-stage renal disease.  The patient completed 2 treatments for      hemodialysis and will continue to have it done in [place] where      she lives.  She tolerated treatment well, still adjusting to it      since recently started in July 2009, she is receiving treatment      with Venofer, Zemplar, and Nephro.  Dr. Darrick Penna (vascular surgeon)      was consulted for left arm AV graft thrombectomy to provide      hemodialysis access and procedure was successfully performed      without complications.  6. UTI.  The patient was admitted with UTI diagnosis that was made at      [facility], and we repeated urinalysis as well as urine  cultures.  Urine cultures grew E. coli.  Blood cultures were      negative x2.  We continued Septra treatment.  UTI resolved on      discharge, and the patient needed to complete the treatment for 2      more days.  7. Diabetes.  During the hospitalization, 10 units of Humulin were      given at bedtime.  CBGs range between 160-260, hemoglobin A1c is      6.7.  8. Anemia.  This is secondary to chronic renal disease, baseline      hemoglobin 10.  Hemoglobin remained stable during the      hospitalization.  9. Adrenal insufficiency.  Chronic  problem for the patient.  She is on      hydrocortisone.   DISCHARGE LABS AND VITALS:  Vitals:  Temperature 98, blood pressure  156/65, pulse 80s to 90s, respirations 20, saturating 95% on room air.  Labs:  Sodium 129, potassium 4.6, chloride 93, bicarb 28, BUN 19,  creatinine 2.19, glucose 249, white blood cells 8.5, hemoglobin 10.8,  platelets 445.      Mliss Sax, MD  Electronically Signed      Alvester Morin, M.D.  Electronically Signed    IM/MEDQ  D:  06/16/2008  T:  06/17/2008  Job:  [id number]

## 2011-06-07 LAB — POCT I-STAT 4, (NA,K, GLUC, HGB,HCT)
HCT: 35 — ABNORMAL LOW
Operator id: 181601
Sodium: 129 — ABNORMAL LOW

## 2011-06-10 LAB — CBC
HCT: 30.4 — ABNORMAL LOW
Hemoglobin: 11 — ABNORMAL LOW
MCHC: 31.5
MCHC: 31.5
MCHC: 32
MCV: 85.1
Platelets: 408 — ABNORMAL HIGH
Platelets: 445 — ABNORMAL HIGH
RBC: 4.06
RDW: 25 — ABNORMAL HIGH
RDW: 25.2 — ABNORMAL HIGH

## 2011-06-10 LAB — RENAL FUNCTION PANEL
Albumin: 2.5 — ABNORMAL LOW
BUN: 19
CO2: 25
CO2: 28
Calcium: 8.8
Chloride: 93 — ABNORMAL LOW
GFR calc Af Amer: 22 — ABNORMAL LOW
GFR calc non Af Amer: 18 — ABNORMAL LOW
Glucose, Bld: 249 — ABNORMAL HIGH
Phosphorus: 3.7
Potassium: 4.6
Sodium: 131 — ABNORMAL LOW

## 2011-06-10 LAB — DIFFERENTIAL
Basophils Absolute: 0.1
Lymphs Abs: 1.2
Monocytes Absolute: 0.8

## 2011-06-10 LAB — GLUCOSE, CAPILLARY
Glucose-Capillary: 166 — ABNORMAL HIGH
Glucose-Capillary: 215 — ABNORMAL HIGH
Glucose-Capillary: 288 — ABNORMAL HIGH

## 2011-06-10 LAB — BASIC METABOLIC PANEL
BUN: 35 — ABNORMAL HIGH
CO2: 27
Calcium: 8.9
Chloride: 102
Creatinine, Ser: 2.51 — ABNORMAL HIGH
Glucose, Bld: 152 — ABNORMAL HIGH
Glucose, Bld: 264 — ABNORMAL HIGH
Potassium: 4.2
Sodium: 133 — ABNORMAL LOW

## 2011-06-12 LAB — CARDIAC PANEL(CRET KIN+CKTOT+MB+TROPI)
CK, MB: 0.8
CK, MB: 1
CK, MB: 1.4
Relative Index: INVALID
Relative Index: INVALID
Relative Index: INVALID
Total CK: 34
Total CK: 9
Total CK: <8
Troponin I: 0.01
Troponin I: 0.02
Troponin I: 0.02

## 2011-06-12 LAB — URINALYSIS, ROUTINE W REFLEX MICROSCOPIC
Bilirubin Urine: NEGATIVE
Glucose, UA: 250 — AB
Ketones, ur: NEGATIVE
Nitrite: POSITIVE — AB
Protein, ur: 100 — AB
Specific Gravity, Urine: 1.013
Urobilinogen, UA: 0.2
pH: 7

## 2011-06-12 LAB — URINE CULTURE
Colony Count: 80000
Special Requests: NEGATIVE

## 2011-06-12 LAB — TSH: TSH: 3.409

## 2011-06-12 LAB — BASIC METABOLIC PANEL
BUN: 37 — ABNORMAL HIGH
BUN: 38 — ABNORMAL HIGH
CO2: 25
Calcium: 8.6
Chloride: 93 — ABNORMAL LOW
Chloride: 94 — ABNORMAL LOW
Creatinine, Ser: 3.5 — ABNORMAL HIGH
GFR calc Af Amer: 15 — ABNORMAL LOW
GFR calc non Af Amer: 12 — ABNORMAL LOW
GFR calc non Af Amer: 13 — ABNORMAL LOW
Glucose, Bld: 178 — ABNORMAL HIGH
Potassium: 3.4 — ABNORMAL LOW
Potassium: 5.1
Sodium: 129 — ABNORMAL LOW
Sodium: 133 — ABNORMAL LOW

## 2011-06-12 LAB — CBC
HCT: 29.2 — ABNORMAL LOW
HCT: 32.3 — ABNORMAL LOW
Hemoglobin: 10.1 — ABNORMAL LOW
Hemoglobin: 9.2 — ABNORMAL LOW
MCHC: 31.6
MCV: 84.6
MCV: 84.7
Platelets: 377
RBC: 3.45 — ABNORMAL LOW
RBC: 3.82 — ABNORMAL LOW
RDW: 24.9 — ABNORMAL HIGH
WBC: 10.9 — ABNORMAL HIGH
WBC: 11.9 — ABNORMAL HIGH

## 2011-06-12 LAB — HEPATIC FUNCTION PANEL
ALT: 34
AST: 46 — ABNORMAL HIGH
Alkaline Phosphatase: 72
Bilirubin, Direct: <0.1
Total Bilirubin: 0.6

## 2011-06-12 LAB — HEMOGLOBIN A1C
Hgb A1c MFr Bld: 6.7 — ABNORMAL HIGH
Mean Plasma Glucose: 146

## 2011-06-12 LAB — CULTURE, BLOOD (ROUTINE X 2)
Culture: NO GROWTH
Culture: NO GROWTH

## 2011-06-12 LAB — DIGOXIN LEVEL: Digoxin Level: 0.3 — ABNORMAL LOW

## 2011-06-12 LAB — RENAL FUNCTION PANEL
Albumin: 2.7 — ABNORMAL LOW
BUN: 37 — ABNORMAL HIGH
CO2: 26
Calcium: 9.3
Chloride: 95 — ABNORMAL LOW
Creatinine, Ser: 3.49 — ABNORMAL HIGH
GFR calc Af Amer: 15 — ABNORMAL LOW
GFR calc non Af Amer: 13 — ABNORMAL LOW
Glucose, Bld: 154 — ABNORMAL HIGH
Phosphorus: 5.1 — ABNORMAL HIGH
Potassium: 4
Sodium: 132 — ABNORMAL LOW

## 2011-06-12 LAB — URINE MICROSCOPIC-ADD ON

## 2011-06-12 LAB — GLUCOSE, CAPILLARY
Glucose-Capillary: 178 — ABNORMAL HIGH
Glucose-Capillary: 211 — ABNORMAL HIGH

## 2012-05-22 ENCOUNTER — Other Ambulatory Visit: Payer: Self-pay

## 2012-05-22 DIAGNOSIS — T82598A Other mechanical complication of other cardiac and vascular devices and implants, initial encounter: Secondary | ICD-10-CM

## 2012-05-27 ENCOUNTER — Encounter: Payer: Self-pay | Admitting: Vascular Surgery

## 2012-06-04 ENCOUNTER — Ambulatory Visit: Payer: Self-pay | Admitting: Vascular Surgery

## 2012-07-29 ENCOUNTER — Encounter: Payer: Self-pay | Admitting: Vascular Surgery

## 2012-07-30 ENCOUNTER — Ambulatory Visit: Payer: Self-pay | Admitting: Vascular Surgery

## 2012-08-19 ENCOUNTER — Encounter: Payer: Self-pay | Admitting: Vascular Surgery

## 2012-08-20 ENCOUNTER — Encounter (INDEPENDENT_AMBULATORY_CARE_PROVIDER_SITE_OTHER): Payer: Medicare Other | Admitting: *Deleted

## 2012-08-20 ENCOUNTER — Encounter: Payer: Self-pay | Admitting: Vascular Surgery

## 2012-08-20 ENCOUNTER — Ambulatory Visit (INDEPENDENT_AMBULATORY_CARE_PROVIDER_SITE_OTHER): Payer: Medicare Other | Admitting: Vascular Surgery

## 2012-08-20 VITALS — BP 152/74 | HR 92 | Ht 62.0 in | Wt 133.7 lb

## 2012-08-20 DIAGNOSIS — N186 End stage renal disease: Secondary | ICD-10-CM

## 2012-08-20 DIAGNOSIS — T82598A Other mechanical complication of other cardiac and vascular devices and implants, initial encounter: Secondary | ICD-10-CM

## 2012-08-20 NOTE — Progress Notes (Signed)
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 The patient states pseudoaneurysm has been present for some time but now has formed a eschar. She has had no bleeding from the graft. The graft is functioning well.  Review of systems: She denies chest pain. She denies shortness of breath. She denies fever or chills.  Physical exam: Filed Vitals:   08/20/12 1609  BP: 152/74  Pulse: 92  Height: 5\' 2"  (1.575 m)  Weight: 133 lb 11.2 oz (60.646 kg)  SpO2: 99%   Left upper extremity: Pulsatile left forearm AV graft, 2 cm pseudoaneurysm on the radial aspect of the forearm with eschar, 2 cm pseudoaneurysm on the ulnar aspect of the forearm with slightly thin skin, venous collaterals in the left upper arm and left chest wall  Data: Duplex ultrasound of the graft was performed today. This suggests a high-grade stenosis at the venous anastomosis. 2 pseudoaneurysms were noted. I interpreted and reviewed the study.  Assessment: Pseudoaneurysm AV graft at risk of rupture and bleeding. Additionally possible venous stenosis. Clinically she may also have central vein stenosis with chest wall collaterals.  Plan: Revision of left forearm AV graft with replacement of segment at the area of pseudoaneurysm containing eschar. She may also need stage revision of the other pseudoaneurysm several weeks down the road. At the time of her revision we will also do a shuntogram and central venogram. She may also require angioplasty or revision of the venous anastomosis as well. Procedure risks benefits details were discussed with the patient and her daughter today. She agrees to proceed. This is scheduled for 08/25/2012.  Fabienne Bruns, MD [facility] [place] Office: [phone] Pager: [phone]

## 2012-08-21 ENCOUNTER — Other Ambulatory Visit: Payer: Self-pay

## 2012-08-24 ENCOUNTER — Encounter (HOSPITAL_COMMUNITY): Payer: Self-pay

## 2012-08-24 MED ORDER — SODIUM CHLORIDE 0.9 % IV SOLN: INTRAVENOUS | Status: DC

## 2012-08-24 MED ORDER — CEFAZOLIN SODIUM-DEXTROSE 2-3 GM-% IV SOLR
2.0000 g | Freq: Once | INTRAVENOUS | Status: AC
  Administered 2012-08-25: 2 g via INTRAVENOUS
  Filled 2012-08-24: qty 50

## 2012-08-25 ENCOUNTER — Encounter (HOSPITAL_COMMUNITY): Payer: Self-pay | Admitting: Anesthesiology

## 2012-08-25 ENCOUNTER — Ambulatory Visit (HOSPITAL_COMMUNITY)
Admission: RE | Admit: 2012-08-25 | Discharge: 2012-08-25 | Disposition: A | Discharge status: Home / Self Care | Payer: Medicare Other | Source: Ambulatory Visit | Attending: Vascular Surgery | Admitting: Vascular Surgery

## 2012-08-25 ENCOUNTER — Encounter (HOSPITAL_COMMUNITY): Payer: Self-pay | Admitting: *Deleted

## 2012-08-25 ENCOUNTER — Ambulatory Visit (HOSPITAL_COMMUNITY): Payer: Medicare Other | Admitting: Anesthesiology

## 2012-08-25 ENCOUNTER — Ambulatory Visit (HOSPITAL_COMMUNITY): Payer: Medicare Other

## 2012-08-25 ENCOUNTER — Telehealth: Payer: Self-pay | Admitting: Vascular Surgery

## 2012-08-25 ENCOUNTER — Encounter (HOSPITAL_COMMUNITY)
Admission: RE | Disposition: A | Discharge status: Home / Self Care | Payer: Self-pay | Source: Ambulatory Visit | Attending: Vascular Surgery

## 2012-08-25 DIAGNOSIS — T82898A Other specified complication of vascular prosthetic devices, implants and grafts, initial encounter: Secondary | ICD-10-CM

## 2012-08-25 DIAGNOSIS — E119 Type 2 diabetes mellitus without complications: Secondary | ICD-10-CM | POA: Insufficient documentation

## 2012-08-25 DIAGNOSIS — Y832 Surgical operation with anastomosis, bypass or graft as the cause of abnormal reaction of the patient, or of later complication, without mention of misadventure at the time of the procedure: Secondary | ICD-10-CM | POA: Insufficient documentation

## 2012-08-25 DIAGNOSIS — K219 Gastro-esophageal reflux disease without esophagitis: Secondary | ICD-10-CM | POA: Insufficient documentation

## 2012-08-25 DIAGNOSIS — N186 End stage renal disease: Secondary | ICD-10-CM | POA: Insufficient documentation

## 2012-08-25 DIAGNOSIS — I4891 Unspecified atrial fibrillation: Secondary | ICD-10-CM | POA: Insufficient documentation

## 2012-08-25 DIAGNOSIS — F329 Major depressive disorder, single episode, unspecified: Secondary | ICD-10-CM | POA: Insufficient documentation

## 2012-08-25 DIAGNOSIS — F3289 Other specified depressive episodes: Secondary | ICD-10-CM | POA: Insufficient documentation

## 2012-08-25 DIAGNOSIS — Z992 Dependence on renal dialysis: Secondary | ICD-10-CM | POA: Insufficient documentation

## 2012-08-25 DIAGNOSIS — I120 Hypertensive chronic kidney disease with stage 5 chronic kidney disease or end stage renal disease: Secondary | ICD-10-CM | POA: Insufficient documentation

## 2012-08-25 DIAGNOSIS — I509 Heart failure, unspecified: Secondary | ICD-10-CM | POA: Insufficient documentation

## 2012-08-25 HISTORY — DX: Major depressive disorder, single episode, unspecified: F32.9

## 2012-08-25 HISTORY — PX: REVISION OF ARTERIOVENOUS GORETEX GRAFT: SHX6073

## 2012-08-25 HISTORY — DX: Pneumonia, unspecified organism: J18.9

## 2012-08-25 HISTORY — PX: SHUNTOGRAM: SHX5491

## 2012-08-25 HISTORY — DX: Unspecified osteoarthritis, unspecified site: M19.90

## 2012-08-25 HISTORY — DX: Myoneural disorder, unspecified: G70.9

## 2012-08-25 HISTORY — DX: Depression, unspecified: F32.A

## 2012-08-25 HISTORY — DX: Gastro-esophageal reflux disease without esophagitis: K21.9

## 2012-08-25 LAB — SURGICAL PCR SCREEN
MRSA, PCR: NEGATIVE
Staphylococcus aureus: POSITIVE — AB

## 2012-08-25 LAB — POCT I-STAT 4, (NA,K, GLUC, HGB,HCT)
Hemoglobin: 11.9 g/dL — ABNORMAL LOW (ref 12.0–15.0)
Potassium: 4.4 mEq/L (ref 3.5–5.1)

## 2012-08-25 SURGERY — REVISION OF ARTERIOVENOUS GORETEX GRAFT
Anesthesia: Monitor Anesthesia Care | Site: Arm Lower | Laterality: Left | Wound class: Clean

## 2012-08-25 MED ORDER — PROPOFOL INFUSION 10 MG/ML OPTIME
INTRAVENOUS | Status: DC | PRN
  Administered 2012-08-25: 50 ug/kg/min via INTRAVENOUS

## 2012-08-25 MED ORDER — HYDROCODONE-ACETAMINOPHEN 5-325 MG PO TABS
1.0000 | ORAL_TABLET | Freq: Once | ORAL | Status: AC
  Administered 2012-08-25: 2 via ORAL

## 2012-08-25 MED ORDER — MUPIROCIN 2 % EX OINT
TOPICAL_OINTMENT | CUTANEOUS | Status: AC
  Filled 2012-08-25: qty 22

## 2012-08-25 MED ORDER — SODIUM CHLORIDE 0.9 % IR SOLN
Status: DC | PRN
  Administered 2012-08-25: 12:00:00

## 2012-08-25 MED ORDER — FENTANYL CITRATE 0.05 MG/ML IJ SOLN
25.0000 ug | INTRAMUSCULAR | Status: DC | PRN
  Administered 2012-08-25: 50 ug via INTRAVENOUS
  Administered 2012-08-25 (×2): 25 ug via INTRAVENOUS

## 2012-08-25 MED ORDER — PROTAMINE SULFATE 10 MG/ML IV SOLN
INTRAVENOUS | Status: DC | PRN
  Administered 2012-08-25: 50 mg via INTRAVENOUS

## 2012-08-25 MED ORDER — LIDOCAINE HCL (PF) 1 % IJ SOLN
INTRAMUSCULAR | Status: DC | PRN
  Administered 2012-08-25: 30 mL

## 2012-08-25 MED ORDER — MUPIROCIN 2 % EX OINT
TOPICAL_OINTMENT | Freq: Two times a day (BID) | CUTANEOUS | Status: DC
  Administered 2012-08-25: 08:00:00 via NASAL

## 2012-08-25 MED ORDER — THROMBIN 20000 UNITS EX SOLR
CUTANEOUS | Status: AC
  Filled 2012-08-25: qty 20000

## 2012-08-25 MED ORDER — TRAMADOL HCL 50 MG PO TABS: 50.0000 mg | ORAL_TABLET | Freq: Four times a day (QID) | ORAL | Status: DC | PRN

## 2012-08-25 MED ORDER — MIDAZOLAM HCL 5 MG/5ML IJ SOLN
INTRAMUSCULAR | Status: DC | PRN
  Administered 2012-08-25: 2 mg via INTRAVENOUS

## 2012-08-25 MED ORDER — HYDROCODONE-ACETAMINOPHEN 5-325 MG PO TABS
ORAL_TABLET | ORAL | Status: AC
  Filled 2012-08-25: qty 2

## 2012-08-25 MED ORDER — THROMBIN 20000 UNITS EX SOLR
CUTANEOUS | Status: DC | PRN
  Administered 2012-08-25: 12:00:00 via TOPICAL

## 2012-08-25 MED ORDER — HEPARIN SODIUM (PORCINE) 1000 UNIT/ML IJ SOLN
INTRAMUSCULAR | Status: DC | PRN
  Administered 2012-08-25: 5000 [IU] via INTRAVENOUS

## 2012-08-25 MED ORDER — FENTANYL CITRATE 0.05 MG/ML IJ SOLN
INTRAMUSCULAR | Status: AC
  Filled 2012-08-25: qty 2

## 2012-08-25 MED ORDER — SODIUM CHLORIDE 0.9 % IV SOLN
INTRAVENOUS | Status: DC | PRN
  Administered 2012-08-25: 10:00:00 via INTRAVENOUS

## 2012-08-25 MED ORDER — IODIXANOL 320 MG/ML IV SOLN
INTRAVENOUS | Status: DC | PRN
  Administered 2012-08-25 (×2): 50 mL via INTRAVENOUS

## 2012-08-25 MED ORDER — PHENYLEPHRINE HCL 10 MG/ML IJ SOLN
INTRAMUSCULAR | Status: DC | PRN
  Administered 2012-08-25 (×7): 80 ug via INTRAVENOUS

## 2012-08-25 MED ORDER — 0.9 % SODIUM CHLORIDE (POUR BTL) OPTIME
TOPICAL | Status: DC | PRN
  Administered 2012-08-25: 1000 mL

## 2012-08-25 MED ORDER — LIDOCAINE HCL (PF) 1 % IJ SOLN
INTRAMUSCULAR | Status: AC
  Filled 2012-08-25: qty 30

## 2012-08-25 MED ORDER — FENTANYL CITRATE 0.05 MG/ML IJ SOLN
INTRAMUSCULAR | Status: DC | PRN
  Administered 2012-08-25: 75 ug via INTRAVENOUS
  Administered 2012-08-25: 25 ug via INTRAVENOUS

## 2012-08-25 SURGICAL SUPPLY — 61 items
ADH SKN CLS APL DERMABOND .7 (GAUZE/BANDAGES/DRESSINGS)
BAG BANDED W/RUBBER/TAPE 36X54 (MISCELLANEOUS) ×2 IMPLANT
BAG EQP BAND 135X91 W/RBR TAPE (MISCELLANEOUS) ×2
BALLN MUSTANG 5.0X40 75 (BALLOONS) ×3
BALLN MUSTANG 7.0X40 75 (BALLOONS) ×3
BALLOON MUSTANG 5.0X40 75 (BALLOONS) ×1 IMPLANT
BALLOON MUSTANG 7.0X40 75 (BALLOONS) ×1 IMPLANT
BANDAGE GAUZE ELAST BULKY 4 IN (GAUZE/BANDAGES/DRESSINGS) ×2 IMPLANT
CANISTER SUCTION 2500CC (MISCELLANEOUS) ×4 IMPLANT
CLIP TI MEDIUM 6 (CLIP) ×4 IMPLANT
CLIP TI WIDE RED SMALL 6 (CLIP) ×4 IMPLANT
CLOTH BEACON ORANGE TIMEOUT ST (SAFETY) ×4 IMPLANT
COVER DOME SNAP 22 D (MISCELLANEOUS) ×2 IMPLANT
COVER PROBE W GEL 5X96 (DRAPES) ×1 IMPLANT
COVER SURGICAL LIGHT HANDLE (MISCELLANEOUS) ×4 IMPLANT
DECANTER SPIKE VIAL GLASS SM (MISCELLANEOUS) ×2 IMPLANT
DERMABOND ADVANCED (GAUZE/BANDAGES/DRESSINGS)
DERMABOND ADVANCED .7 DNX12 (GAUZE/BANDAGES/DRESSINGS) ×2 IMPLANT
DRAIN PENROSE 1/4X12 LTX STRL (WOUND CARE) ×1 IMPLANT
ELECT REM PT RETURN 9FT ADLT (ELECTROSURGICAL) ×3
ELECTRODE REM PT RTRN 9FT ADLT (ELECTROSURGICAL) ×3 IMPLANT
GAUZE SPONGE 2X2 8PLY STRL LF (GAUZE/BANDAGES/DRESSINGS) ×2 IMPLANT
GEL ULTRASOUND 20GR AQUASONIC (MISCELLANEOUS) IMPLANT
GLOVE BIO SURGEON STRL SZ 6.5 (GLOVE) ×4 IMPLANT
GLOVE BIO SURGEON STRL SZ7.5 (GLOVE) ×4 IMPLANT
GLOVE BIOGEL PI IND STRL 6.5 (GLOVE) ×2 IMPLANT
GLOVE BIOGEL PI INDICATOR 6.5 (GLOVE) ×2
GLOVE SS BIOGEL STRL SZ 6.5 (GLOVE) ×2 IMPLANT
GLOVE SUPERSENSE BIOGEL SZ 6.5 (GLOVE) ×2
GLOVE SURG SS PI 7.0 STRL IVOR (GLOVE) ×2 IMPLANT
GOWN PREVENTION PLUS XLARGE (GOWN DISPOSABLE) ×4 IMPLANT
GOWN STRL NON-REIN LRG LVL3 (GOWN DISPOSABLE) ×8 IMPLANT
GOWN STRL REIN XL XLG (GOWN DISPOSABLE) ×2 IMPLANT
GRAFT GORETEX 6X10 (Vascular Products) ×2 IMPLANT
KIT BASIN OR (CUSTOM PROCEDURE TRAY) ×4 IMPLANT
KIT ENCORE 26 ADVANTAGE (KITS) ×2 IMPLANT
KIT ROOM TURNOVER OR (KITS) ×4 IMPLANT
LOOP VESSEL MINI RED (MISCELLANEOUS) IMPLANT
NS IRRIG 1000ML POUR BTL (IV SOLUTION) ×4 IMPLANT
PACK CV ACCESS (CUSTOM PROCEDURE TRAY) ×4 IMPLANT
PAD ARMBOARD 7.5X6 YLW CONV (MISCELLANEOUS) ×8 IMPLANT
SET MICROPUNCTURE 5F STIFF (MISCELLANEOUS) ×2 IMPLANT
SHEATH PINNACLE R/O II 6F 4CM (SHEATH) ×2 IMPLANT
SPONGE GAUZE 2X2 STER 10/PKG (GAUZE/BANDAGES/DRESSINGS)
SPONGE SURGIFOAM ABS GEL 100 (HEMOSTASIS) ×2 IMPLANT
STOPCOCK 4 WAY LG BORE MALE ST (IV SETS) ×2 IMPLANT
SUT ETHILON 3 0 PS 1 (SUTURE) ×4 IMPLANT
SUT MNCRL AB 4-0 PS2 18 (SUTURE) ×2 IMPLANT
SUT PROLENE 6 0 CC (SUTURE) ×3 IMPLANT
SUT PROLENE 7 0 BV 1 (SUTURE) ×1 IMPLANT
SUT VIC AB 3-0 SH 27 (SUTURE) ×3
SUT VIC AB 3-0 SH 27X BRD (SUTURE) ×3 IMPLANT
SUT VICRYL 4-0 PS2 18IN ABS (SUTURE) ×6 IMPLANT
SYR 20CC LL (SYRINGE) ×4 IMPLANT
TAPE CLOTH SURG 4X10 WHT LF (GAUZE/BANDAGES/DRESSINGS) ×2 IMPLANT
TOWEL OR 17X24 6PK STRL BLUE (TOWEL DISPOSABLE) ×4 IMPLANT
TOWEL OR 17X26 10 PK STRL BLUE (TOWEL DISPOSABLE) ×4 IMPLANT
TUBING EXTENTION W/L.L. (IV SETS) ×2 IMPLANT
UNDERPAD 30X30 INCONTINENT (UNDERPADS AND DIAPERS) ×4 IMPLANT
WATER STERILE IRR 1000ML POUR (IV SOLUTION) ×4 IMPLANT
WIRE BENTSON .035X145CM (WIRE) ×2 IMPLANT

## 2012-08-25 NOTE — Interval H&P Note (Signed)
History and Physical Interval Note:  08/25/2012 9:04 AM  [name]  has presented today for surgery, with the diagnosis of Pseudoaneurysm left forearm AVG End Stage Renal Disease  The various methods of treatment have been discussed with the patient and family. After consideration of risks, benefits and other options for treatment, the patient has consented to  Procedure(s) (LRB) with comments: REVISION OF ARTERIOVENOUS GORETEX GRAFT (Left) - Shuntogram (L) arm; possible angioplasty;/ Revision left arm arteriovenous gortex graft SHUNTOGRAM (Left) FISTULOGRAM (Left) as a surgical intervention .  The patient's history has been reviewed, patient examined, no change in status, stable for surgery.  I have reviewed the patient's chart and labs.  Questions were answered to the patient's satisfaction.     FIELDS,CHARLES E

## 2012-08-25 NOTE — Anesthesia Preprocedure Evaluation (Addendum)
Anesthesia Evaluation  Patient identified by MRN, date of birth, ID band Patient awake    Reviewed: Allergy & Precautions, H&P , NPO status , Patient's Chart, lab work & pertinent test results, reviewed documented beta blocker date and time   Airway Mallampati: II TM Distance: >3 FB Neck ROM: Full    Dental  (+) Edentulous Upper   Pulmonary    Pulmonary exam normal       Cardiovascular hypertension, Pt. on medications and Pt. on home beta blockers +CHF + dysrhythmias Atrial Fibrillation Rhythm:Irregular     Neuro/Psych Depression    GI/Hepatic GERD-  Medicated and Controlled,  Endo/Other  diabetes, Well Controlled, Type 2, Insulin Dependent  Renal/GU ESRFRenal disease     Musculoskeletal   Abdominal Normal abdominal exam  (+)   Peds  Hematology   Anesthesia Other Findings   Reproductive/Obstetrics                           Anesthesia Physical Anesthesia Plan  ASA: III  Anesthesia Plan: MAC   Post-op Pain Management:    Induction:   Airway Management Planned: Mask  Additional Equipment:   Intra-op Plan:   Post-operative Plan:   Informed Consent: I have reviewed the patients History and Physical, chart, labs and discussed the procedure including the risks, benefits and alternatives for the proposed anesthesia with the patient or authorized representative who has indicated his/her understanding and acceptance.   Dental advisory given  Plan Discussed with: CRNA, Anesthesiologist and Surgeon  Anesthesia Plan Comments:         Anesthesia Quick Evaluation

## 2012-08-25 NOTE — Progress Notes (Signed)
Short stay is unable to take pt at this time. Will discharge from PACU.

## 2012-08-25 NOTE — Preoperative (Signed)
Beta Blockers   Reason not to administer Beta Blockers:Not Applicable 

## 2012-08-25 NOTE — Anesthesia Postprocedure Evaluation (Signed)
  Anesthesia Post-op Note  Patient: [name]  Procedure(s) Performed: Procedure(s) (LRB) with comments: REVISION OF ARTERIOVENOUS GORETEX GRAFT (Left) - Angioplasty SHUNTOGRAM (Left)  Patient Location: PACU  Anesthesia Type:MAC  Level of Consciousness: awake  Airway and Oxygen Therapy: Patient Spontanous Breathing and Patient connected to face mask oxygen  Post-op Pain: none  Post-op Assessment: Post-op Vital signs reviewed, Patient's Cardiovascular Status Stable, Respiratory Function Stable, Patent Airway and No signs of Nausea or vomiting  Post-op Vital Signs: Reviewed and stable  Complications: No apparent anesthesia complications

## 2012-08-25 NOTE — Op Note (Signed)
Procedure: Revision left forearm AV graft, shuntogram left arm AV graft, angioplasty of left arm AV graft venous outflow  Preoperative diagnosis: 1.  Degeneration left forearm AV graft 2. Venous outflow stenosis  Postoperative diagnosis: Same  Anesthesia: Local with sedation  Operative details: After obtaining informed consent, the patient was taken to operating room 16. The patient was placed in supine position on the Angio table. Entire left upper extremity was prepped and draped in usual sterile fashion.   Next local anesthesia was infiltrated along the radial aspect of the graft near the degenerated segment. This was near the apex of the graft. A transverse incision was made in the forearm above and below the area of degeneration. Each incision was carried into the subcutaneous tissues down to the level of the graft. The graft was dissected free circumferentially. The graft was well incorporated. The patient's skin was quite thin and friable. Care was taken to try to avoid injury of this. After the graft been dissected free circumferentially over several centimeters, the patient was given 5000 units of intravenous heparin. After 2 minutes of circulation time the graft was clamped proximally and distally. The graft was then divided at the proximal clamp and the distal clamp. A new 6 mm interposition graft was brought up on the operative field and this was tunneled subcutaneously lateral to the old graft. It was then cut to length and sewn end to end to the proximal arterial end of the graft and end-to-end to the more distal venous limb of the graft near the apex. Just prior to completion of the anastomosis it was forebled backbled and thoroughly flushed. Anastomosis was secured; clamps released; and there was good pulsatile flow in the graft immediately. Hemostasis was obtained with thrombin Gelfoam. Next a 4-0 subcuticular stitch was used to close each incision. Again this was fairly tedious due to the  fact the patient's skin was quite thin and did not hold suture very well. After each incision was closed, Dermabond was applied to each incision.  Next, a micropuncture needle was brought up on the operative field and this was used to directly cannulate the graft. A micropuncture wire was then threaded into the graft and the micropuncture sheath threaded over this. Sheath was thoroughly flushed with heparinized saline and the dilator was removed. Contrast angiogram was then performed of the fistula.   The central venous structures are patent. There is a high-grade stenosis 70% in the venous outflow at the mid brachium level.  There are 2 intravenous stents both of which are approximately 5 cm in length proximal to this stenosis.  These are both patent. The remainder of the venous limb of the graft is also patent.   At this point, it was decided to intervene on the stenosis. The micropuncture sheath was exchanged over an 035 versacore wire for a 6 French short sheath. This was thoroughly flushed with heparinized saline. Using anatomical landmarks an 035 Benson wire was advanced into the central venous system. A 5 x 40 mm angioplasty balloon was brought in operative field and advanced over the guidewire past the stenosis.   The balloon was centered at the stenosis using angiographic and anatomic landmarks.  The balloon was inflated to 10 Atm. Repeat contrast angiogram showed still a 50% residual stenosis. The 5 x 40 balloon was removed and exchanged for a 7 x 40 angioplasty balloon.  This was advanced to the stenosis and inflated to 10 atmospheres for 60 seconds. Contrast angiogram was again repeated which  showed the 70% stenosis had been reduced to less than 25%. I felt that angioplasty with a larger balloon would have put the fistula at risk of rupture. Therefore, the balloon was then pulled back over the guidewire and removed as well as the wire.    A 3-0 Monocryl pursestring stitch was placed around the sheath  and the sheath removed. Hemostasis was obtained.  The patient tolerated the procedure well and there were no complications. The patient was taken to the holding area in stable condition.  Operative findings: 70% stenosis of mid fistula angioplastied to <25% stenosis           No central vein stenosis                                30% of distal arterial limb of graft were placed a 6 mm interposition graft          graft is ready for use  Will need to be stuck along the venous limb only for approximately one month. She has an existing pseudoaneurysm along the venous limb of the graft that will also need to be revised in the future as well.  Fabienne Bruns, MD [facility] [place] Office: [phone] Pager: [phone]

## 2012-08-25 NOTE — H&P (View-Only) (Signed)
Patient is an 78-year-old female sent for evaluation of a pseudoaneurysm of her left forearm AV graft. The patient states pseudoaneurysm has been present for some time but now has formed a eschar. She has had no bleeding from the graft. The graft is functioning well.  Review of systems: She denies chest pain. She denies shortness of breath. She denies fever or chills.  Physical exam: Filed Vitals:   08/20/12 1609  BP: 152/74  Pulse: 92  Height: 5' 2" (1.575 m)  Weight: 133 lb 11.2 oz (60.646 kg)  SpO2: 99%   Left upper extremity: Pulsatile left forearm AV graft, 2 cm pseudoaneurysm on the radial aspect of the forearm with eschar, 2 cm pseudoaneurysm on the ulnar aspect of the forearm with slightly thin skin, venous collaterals in the left upper arm and left chest wall  Data: Duplex ultrasound of the graft was performed today. This suggests a high-grade stenosis at the venous anastomosis. 2 pseudoaneurysms were noted. I interpreted and reviewed the study.  Assessment: Pseudoaneurysm AV graft at risk of rupture and bleeding. Additionally possible venous stenosis. Clinically she may also have central vein stenosis with chest wall collaterals.  Plan: Revision of left forearm AV graft with replacement of segment at the area of pseudoaneurysm containing eschar. She may also need stage revision of the other pseudoaneurysm several weeks down the road. At the time of her revision we will also do a shuntogram and central venogram. She may also require angioplasty or revision of the venous anastomosis as well. Procedure risks benefits details were discussed with the patient and her daughter today. She agrees to proceed. This is scheduled for 08/25/2012.  Charles Fields, MD [facility] [place] Office: [phone] Pager: [phone]  

## 2012-08-25 NOTE — Transfer of Care (Signed)
Immediate Anesthesia Transfer of Care Note  Patient: [name]  Procedure(s) Performed: Procedure(s) (LRB) with comments: REVISION OF ARTERIOVENOUS GORETEX GRAFT (Left) - Angioplasty SHUNTOGRAM (Left)  Patient Location: PACU  Anesthesia Type:MAC  Level of Consciousness: awake, alert  and oriented  Airway & Oxygen Therapy: Patient Spontanous Breathing and Patient connected to face mask oxygen  Post-op Assessment: Report given to PACU RN  Post vital signs: Reviewed and stable  Complications: No apparent anesthesia complications

## 2012-08-25 NOTE — Telephone Encounter (Addendum)
Message copied by Shari Prows on Tue Aug 25, 2012  3:28 PM ------      Message from: Sharee Pimple      Created: Tue Aug 25, 2012  2:58 PM      Regarding: schedule                   ----- Message -----         From: Dara Lords, PA         Sent: 08/25/2012  12:30 PM           To: Sharee Pimple, CMA            Revision of Left FA AVG.  Needs to f/u with Dr. Darrick Penna in 2 weeks for wound check.            Thanks,      Lelon Mast  I scheduled an appt for the above pt on 09/10/12 at 8:30am w/ [facility]. I was unable to leave a voice mail message for the pt but I did mail an appt letter.awt

## 2012-08-27 ENCOUNTER — Encounter (HOSPITAL_COMMUNITY): Payer: Self-pay | Admitting: Vascular Surgery

## 2012-08-28 ENCOUNTER — Telehealth: Payer: Self-pay | Admitting: *Deleted

## 2012-08-28 NOTE — Telephone Encounter (Signed)
Patient's niece ([name])called to report that [name] had a swollen lump the size of an orange at her elbow which is red and very sore. Patient did not go to HD today because of this pain, she is scheduled to go on Sunday [facility]. Pt's wounds are clean and dry, pt is afebrile per niece. I discussed this with Oswaldo Done, RN and we advised the patient to go to the ED for further evaluation ( We do not have a doctor in the office this afternoon). Selena Batten will take patient for evaluation.

## 2012-09-08 ENCOUNTER — Encounter: Payer: Self-pay | Admitting: Vascular Surgery

## 2012-09-10 ENCOUNTER — Encounter: Payer: Self-pay | Admitting: Vascular Surgery

## 2012-09-10 ENCOUNTER — Ambulatory Visit (INDEPENDENT_AMBULATORY_CARE_PROVIDER_SITE_OTHER): Payer: Medicare Other | Admitting: Vascular Surgery

## 2012-09-10 VITALS — BP 140/54 | HR 75 | Temp 97.5°F | Ht 62.0 in | Wt 136.3 lb

## 2012-09-10 DIAGNOSIS — N186 End stage renal disease: Secondary | ICD-10-CM

## 2012-09-10 NOTE — Progress Notes (Signed)
Patient is an 79 year old female who is status post revision of a left forearm AV graft for ulceration. This was done on December 17. She returns today for further followup. There are sticking the ulnar aspect of the graft currently. She states she has had some bloody drainage from the old ulcerated area. She denies any fever or chills.  Physical exam: Filed Vitals:   09/10/12 1011  BP: 140/54  Pulse: 75  Temp: 97.5 F (36.4 C)  TempSrc: Oral  Height: 5\' 2"  (1.575 m)  Weight: 136 lb 4.8 oz (61.825 kg)  SpO2: 100%   Left forearm: Healing incisions radial aspect of left forearm AV graft 3 mm ulceration over old graft with no significant erythema or drainage, audible bruit, some edema  Assessment: Healing left forearm AV graft  Plan: Followup in 3 weeks to consider replacement of the ulnar aspect of the graft which has a pseudoaneurysm  Fabienne Bruns, MD [facility] Office: [phone] Pager: [phone]

## 2012-10-07 ENCOUNTER — Encounter: Payer: Self-pay | Admitting: Vascular Surgery

## 2012-10-08 ENCOUNTER — Ambulatory Visit (INDEPENDENT_AMBULATORY_CARE_PROVIDER_SITE_OTHER): Payer: Medicare Other | Admitting: Vascular Surgery

## 2012-10-08 ENCOUNTER — Encounter: Payer: Self-pay | Admitting: Vascular Surgery

## 2012-10-08 VITALS — BP 176/66 | HR 84 | Ht 62.0 in | Wt 136.2 lb

## 2012-10-08 DIAGNOSIS — N186 End stage renal disease: Secondary | ICD-10-CM

## 2012-10-08 NOTE — Progress Notes (Signed)
Patient is an 79 year old female status post revision of a left forearm graft approximately 3 weeks ago. She had replacement of the radial segment for degeneration. She apparently had some erythema and drainage postoperatively. She is currently on IV antibiotics with dialysis. She was very upset during her office visit today as her son passed away recently. She denies any fever or chills.  Physical exam:  Filed Vitals:   10/08/12 1439  BP: 176/66  Pulse: 84  Height: 5\' 2"  (1.575 m)  Weight: 136 lb 3.2 oz (61.78 kg)  SpO2: 100%   Left upper extremity: Incisions on the lateral aspect of her forearm graft are healing there is a small amount of serous drainage from the corner of one incision on the radial limb. There is no surrounding graft erythema. There is no graft tenderness. 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  Assessment/Plan Healing left forearm AV graft still with some drainage concerning for infection. We'll continue antibiotics for now. Follow up 2 weeks.  Fabienne Bruns, MD [facility] Office: [phone] Pager: [phone]

## 2012-10-22 ENCOUNTER — Ambulatory Visit: Payer: Medicare Other | Admitting: Vascular Surgery

## 2012-10-28 ENCOUNTER — Encounter: Payer: Self-pay | Admitting: Vascular Surgery

## 2012-10-29 ENCOUNTER — Encounter: Payer: Self-pay | Admitting: Vascular Surgery

## 2012-10-29 ENCOUNTER — Ambulatory Visit (INDEPENDENT_AMBULATORY_CARE_PROVIDER_SITE_OTHER): Payer: Medicare Other | Admitting: Vascular Surgery

## 2012-10-29 VITALS — BP 162/90 | HR 82 | Resp 18 | Ht 62.0 in | Wt 135.9 lb

## 2012-10-29 DIAGNOSIS — N186 End stage renal disease: Secondary | ICD-10-CM

## 2012-10-29 NOTE — Progress Notes (Signed)
Patient is an 79 year old female status post revision of a left forearm graft approximately 3 weeks ago. She had replacement of the radial segment for degeneration. She apparently had some erythema and drainage postoperatively. She is currently on IV antibiotics with dialysis. She was very upset during her office visit today as her son passed away recently. She denies any fever or chills.  Physical exam:  Filed Vitals:   10/29/12 1329  BP: 162/90  Pulse: 82  Resp: 18  Height: 5\' 2"  (1.575 m)  Weight: 135 lb 14.4 oz (61.644 kg)   Left upper extremity: Incisions on the lateral aspect of her forearm graft are healing there is a small amount of serous drainage from the corner of one incision on the radial limb. There is no surrounding graft erythema. There is now a segment of exposed defunctionalized graft.  There is no graft tenderness. 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   Assessment/Plan chronic sinus tract with exposed old nonfunctional graft. The new segment of graft is at risk for infection I believe this old segment needs to be removed.  We have scheduled this for Tuesday, February 25  Fabienne Bruns, MD  [facility]  Office: [phone]  Pager: [phone]

## 2012-10-30 ENCOUNTER — Other Ambulatory Visit: Payer: Self-pay | Admitting: *Deleted

## 2012-11-02 ENCOUNTER — Encounter (HOSPITAL_COMMUNITY): Payer: Self-pay

## 2012-11-02 MED ORDER — VANCOMYCIN HCL 10 G IV SOLR
1500.0000 mg | INTRAVENOUS | Status: AC
  Administered 2012-11-03: 1500 mg via INTRAVENOUS
  Filled 2012-11-02: qty 1500

## 2012-11-03 ENCOUNTER — Encounter (HOSPITAL_COMMUNITY)
Admission: RE | Disposition: A | Discharge status: Home / Self Care | Payer: Self-pay | Source: Ambulatory Visit | Attending: Vascular Surgery

## 2012-11-03 ENCOUNTER — Ambulatory Visit (HOSPITAL_COMMUNITY): Payer: Medicare Other

## 2012-11-03 ENCOUNTER — Telehealth: Payer: Self-pay | Admitting: Vascular Surgery

## 2012-11-03 ENCOUNTER — Observation Stay (HOSPITAL_COMMUNITY)
Admission: RE | Admit: 2012-11-03 | Discharge: 2012-11-04 | Disposition: A | Discharge status: Home / Self Care | Payer: Medicare Other | Source: Ambulatory Visit | Attending: Vascular Surgery | Admitting: Vascular Surgery

## 2012-11-03 ENCOUNTER — Ambulatory Visit (HOSPITAL_COMMUNITY): Payer: Medicare Other | Admitting: Anesthesiology

## 2012-11-03 ENCOUNTER — Encounter (HOSPITAL_COMMUNITY): Payer: Self-pay | Admitting: Anesthesiology

## 2012-11-03 ENCOUNTER — Encounter (HOSPITAL_COMMUNITY): Payer: Self-pay | Admitting: Certified Registered"

## 2012-11-03 ENCOUNTER — Encounter (HOSPITAL_COMMUNITY): Payer: Self-pay | Admitting: Pharmacy Technician

## 2012-11-03 DIAGNOSIS — Y832 Surgical operation with anastomosis, bypass or graft as the cause of abnormal reaction of the patient, or of later complication, without mention of misadventure at the time of the procedure: Secondary | ICD-10-CM | POA: Insufficient documentation

## 2012-11-03 DIAGNOSIS — Z992 Dependence on renal dialysis: Secondary | ICD-10-CM | POA: Insufficient documentation

## 2012-11-03 DIAGNOSIS — E119 Type 2 diabetes mellitus without complications: Secondary | ICD-10-CM | POA: Insufficient documentation

## 2012-11-03 DIAGNOSIS — I509 Heart failure, unspecified: Secondary | ICD-10-CM | POA: Insufficient documentation

## 2012-11-03 DIAGNOSIS — T827XXA Infection and inflammatory reaction due to other cardiac and vascular devices, implants and grafts, initial encounter: Secondary | ICD-10-CM

## 2012-11-03 DIAGNOSIS — N186 End stage renal disease: Secondary | ICD-10-CM | POA: Insufficient documentation

## 2012-11-03 DIAGNOSIS — I120 Hypertensive chronic kidney disease with stage 5 chronic kidney disease or end stage renal disease: Secondary | ICD-10-CM | POA: Insufficient documentation

## 2012-11-03 HISTORY — PX: OTHER SURGICAL HISTORY: SHX169

## 2012-11-03 HISTORY — PX: INSERTION OF DIALYSIS CATHETER: SHX1324

## 2012-11-03 HISTORY — PX: AVGG REMOVAL: SHX5153

## 2012-11-03 HISTORY — DX: Infection and inflammatory reaction due to other cardiac and vascular devices, implants and grafts, initial encounter: T82.7XXA

## 2012-11-03 LAB — SURGICAL PCR SCREEN
MRSA, PCR: NEGATIVE
Staphylococcus aureus: NEGATIVE

## 2012-11-03 LAB — POCT I-STAT 4, (NA,K, GLUC, HGB,HCT): Sodium: 135 mEq/L (ref 135–145)

## 2012-11-03 LAB — GLUCOSE, CAPILLARY
Glucose-Capillary: 97 mg/dL (ref 70–99)
Glucose-Capillary: 112 mg/dL — ABNORMAL HIGH (ref 70–99)

## 2012-11-03 SURGERY — REMOVAL OF ARTERIOVENOUS GORETEX GRAFT (AVGG)
Anesthesia: General | Site: Neck | Laterality: Left | Wound class: Dirty or Infected

## 2012-11-03 MED ORDER — SERTRALINE HCL 50 MG PO TABS
50.0000 mg | ORAL_TABLET | Freq: Every day | ORAL | Status: DC
  Filled 2012-11-03: qty 1

## 2012-11-03 MED ORDER — LABETALOL HCL 5 MG/ML IV SOLN
10.0000 mg | INTRAVENOUS | Status: DC | PRN
  Filled 2012-11-03: qty 4

## 2012-11-03 MED ORDER — HYDROMORPHONE HCL PF 1 MG/ML IJ SOLN: 0.5000 mg | INTRAMUSCULAR | Status: DC | PRN

## 2012-11-03 MED ORDER — ONDANSETRON HCL 4 MG/2ML IJ SOLN: 4.0000 mg | Freq: Four times a day (QID) | INTRAMUSCULAR | Status: DC | PRN

## 2012-11-03 MED ORDER — THROMBIN 20000 UNITS EX SOLR
OROMUCOSAL | Status: DC | PRN
  Administered 2012-11-03: 10:00:00 via TOPICAL

## 2012-11-03 MED ORDER — HYDROXYZINE HCL 25 MG PO TABS
25.0000 mg | ORAL_TABLET | Freq: Three times a day (TID) | ORAL | Status: DC | PRN
  Administered 2012-11-04: 25 mg via ORAL
  Filled 2012-11-03 (×3): qty 1

## 2012-11-03 MED ORDER — EPHEDRINE SULFATE 50 MG/ML IJ SOLN
INTRAMUSCULAR | Status: DC | PRN
  Administered 2012-11-03 (×2): 10 mg via INTRAVENOUS
  Administered 2012-11-03 (×2): 15 mg via INTRAVENOUS

## 2012-11-03 MED ORDER — MUPIROCIN 2 % EX OINT
TOPICAL_OINTMENT | Freq: Once | CUTANEOUS | Status: AC
  Administered 2012-11-03: 1 via NASAL

## 2012-11-03 MED ORDER — HYDROCODONE-ACETAMINOPHEN 7.5-325 MG PO TABS: 1.0000 | ORAL_TABLET | Freq: Four times a day (QID) | ORAL | Status: AC | PRN

## 2012-11-03 MED ORDER — METOPROLOL TARTRATE 1 MG/ML IV SOLN
2.0000 mg | INTRAVENOUS | Status: DC | PRN
  Filled 2012-11-03: qty 5

## 2012-11-03 MED ORDER — HYDRALAZINE HCL 20 MG/ML IJ SOLN
10.0000 mg | INTRAMUSCULAR | Status: DC | PRN
  Filled 2012-11-03: qty 0.5

## 2012-11-03 MED ORDER — SODIUM CHLORIDE 0.9 % IJ SOLN: 3.0000 mL | INTRAMUSCULAR | Status: DC | PRN

## 2012-11-03 MED ORDER — HEPARIN SODIUM (PORCINE) 1000 UNIT/ML IJ SOLN
INTRAMUSCULAR | Status: DC | PRN
  Administered 2012-11-03: 4.6 mL

## 2012-11-03 MED ORDER — SODIUM CHLORIDE 0.9 % IV SOLN: 250.0000 mL | INTRAVENOUS | Status: DC | PRN

## 2012-11-03 MED ORDER — THROMBIN 20000 UNITS EX SOLR
CUTANEOUS | Status: AC
  Filled 2012-11-03: qty 20000

## 2012-11-03 MED ORDER — ACETAMINOPHEN 325 MG RE SUPP
325.0000 mg | RECTAL | Status: DC | PRN
  Filled 2012-11-03: qty 2

## 2012-11-03 MED ORDER — SIMVASTATIN 40 MG PO TABS
40.0000 mg | ORAL_TABLET | Freq: Every evening | ORAL | Status: DC
  Administered 2012-11-03: 40 mg via ORAL
  Filled 2012-11-03 (×2): qty 1

## 2012-11-03 MED ORDER — FENTANYL CITRATE 0.05 MG/ML IJ SOLN
INTRAMUSCULAR | Status: AC
  Administered 2012-11-03: 25 ug via INTRAVENOUS
  Filled 2012-11-03: qty 2

## 2012-11-03 MED ORDER — CALCIUM ACETATE 667 MG PO CAPS
667.0000 mg | ORAL_CAPSULE | Freq: Three times a day (TID) | ORAL | Status: DC
  Administered 2012-11-03 – 2012-11-04 (×2): 667 mg via ORAL
  Filled 2012-11-03 (×5): qty 2

## 2012-11-03 MED ORDER — HEPARIN SODIUM (PORCINE) 1000 UNIT/ML IJ SOLN
INTRAMUSCULAR | Status: DC | PRN
  Administered 2012-11-03: 5000 [IU] via INTRAVENOUS

## 2012-11-03 MED ORDER — SODIUM CHLORIDE 0.9 % IV SOLN
INTRAVENOUS | Status: DC
  Administered 2012-11-03: 07:00:00 via INTRAVENOUS

## 2012-11-03 MED ORDER — ZOLPIDEM TARTRATE 10 MG PO TABS: 5.0000 mg | ORAL_TABLET | Freq: Every evening | ORAL | Status: DC | PRN

## 2012-11-03 MED ORDER — PROPOFOL 10 MG/ML IV BOLUS
INTRAVENOUS | Status: DC | PRN
  Administered 2012-11-03 (×3): 20 mg via INTRAVENOUS
  Administered 2012-11-03: 10 mg via INTRAVENOUS
  Administered 2012-11-03: 20 mg via INTRAVENOUS
  Administered 2012-11-03: 110 mg via INTRAVENOUS

## 2012-11-03 MED ORDER — ACETAMINOPHEN 325 MG PO TABS: 325.0000 mg | ORAL_TABLET | ORAL | Status: DC | PRN

## 2012-11-03 MED ORDER — CAMPHOR-MENTHOL 0.5-0.5 % EX LOTN
1.0000 "application " | TOPICAL_LOTION | Freq: Three times a day (TID) | CUTANEOUS | Status: DC | PRN
  Filled 2012-11-03: qty 222

## 2012-11-03 MED ORDER — 0.9 % SODIUM CHLORIDE (POUR BTL) OPTIME
TOPICAL | Status: DC | PRN
  Administered 2012-11-03: 1000 mL

## 2012-11-03 MED ORDER — CARVEDILOL 12.5 MG PO TABS
12.5000 mg | ORAL_TABLET | Freq: Two times a day (BID) | ORAL | Status: DC
  Administered 2012-11-03: 12.5 mg via ORAL
  Filled 2012-11-03 (×4): qty 1

## 2012-11-03 MED ORDER — MUPIROCIN 2 % EX OINT
TOPICAL_OINTMENT | CUTANEOUS | Status: AC
  Filled 2012-11-03: qty 22

## 2012-11-03 MED ORDER — SORBITOL 70 % SOLN: 30.0000 mL | Status: DC | PRN

## 2012-11-03 MED ORDER — FOSINOPRIL SODIUM 20 MG PO TABS
20.0000 mg | ORAL_TABLET | Freq: Every day | ORAL | Status: DC
  Filled 2012-11-03: qty 1

## 2012-11-03 MED ORDER — PROTAMINE SULFATE 10 MG/ML IV SOLN
INTRAVENOUS | Status: DC | PRN
  Administered 2012-11-03 (×5): 10 mg via INTRAVENOUS

## 2012-11-03 MED ORDER — ARTIFICIAL TEARS OP OINT
TOPICAL_OINTMENT | OPHTHALMIC | Status: DC | PRN
  Administered 2012-11-03: 1 via OPHTHALMIC

## 2012-11-03 MED ORDER — FENTANYL CITRATE 0.05 MG/ML IJ SOLN
25.0000 ug | INTRAMUSCULAR | Status: DC | PRN
  Administered 2012-11-03: 25 ug via INTRAVENOUS

## 2012-11-03 MED ORDER — MORPHINE SULFATE 2 MG/ML IJ SOLN
2.0000 mg | INTRAMUSCULAR | Status: DC | PRN
Start: 2012-11-03 — End: 2012-11-04
  Administered 2012-11-03: 2 mg via INTRAVENOUS
  Filled 2012-11-03: qty 1

## 2012-11-03 MED ORDER — FENTANYL CITRATE 0.05 MG/ML IJ SOLN
INTRAMUSCULAR | Status: DC | PRN
  Administered 2012-11-03 (×3): 25 ug via INTRAVENOUS
  Administered 2012-11-03: 50 ug via INTRAVENOUS

## 2012-11-03 MED ORDER — CELECOXIB 200 MG PO CAPS
200.0000 mg | ORAL_CAPSULE | Freq: Every day | ORAL | Status: DC
  Administered 2012-11-03: 200 mg via ORAL
  Filled 2012-11-03 (×2): qty 1

## 2012-11-03 MED ORDER — WHITE PETROLATUM GEL
Status: AC
  Administered 2012-11-03: 0.2
  Filled 2012-11-03: qty 5

## 2012-11-03 MED ORDER — PHENYLEPHRINE HCL 10 MG/ML IJ SOLN
INTRAMUSCULAR | Status: DC | PRN
  Administered 2012-11-03 (×2): 160 ug via INTRAVENOUS
  Administered 2012-11-03: 40 ug via INTRAVENOUS
  Administered 2012-11-03: 120 ug via INTRAVENOUS
  Administered 2012-11-03: 80 ug via INTRAVENOUS
  Administered 2012-11-03: 40 ug via INTRAVENOUS
  Administered 2012-11-03: 160 ug via INTRAVENOUS
  Administered 2012-11-03 (×3): 80 ug via INTRAVENOUS
  Administered 2012-11-03: 40 ug via INTRAVENOUS
  Administered 2012-11-03: 80 ug via INTRAVENOUS
  Administered 2012-11-03: 40 ug via INTRAVENOUS
  Administered 2012-11-03: 160 ug via INTRAVENOUS
  Administered 2012-11-03: 120 ug via INTRAVENOUS
  Administered 2012-11-03: 80 ug via INTRAVENOUS
  Administered 2012-11-03: 40 ug via INTRAVENOUS
  Administered 2012-11-03: 80 ug via INTRAVENOUS
  Administered 2012-11-03: 120 ug via INTRAVENOUS

## 2012-11-03 MED ORDER — HYDROCODONE-ACETAMINOPHEN 7.5-325 MG PO TABS
1.0000 | ORAL_TABLET | Freq: Four times a day (QID) | ORAL | Status: DC | PRN
  Administered 2012-11-03 – 2012-11-04 (×2): 1 via ORAL
  Filled 2012-11-03 (×2): qty 1

## 2012-11-03 MED ORDER — DOCUSATE SODIUM 283 MG RE ENEM: 1.0000 | ENEMA | RECTAL | Status: DC | PRN

## 2012-11-03 MED ORDER — NEPRO/CARBSTEADY PO LIQD: 237.0000 mL | Freq: Three times a day (TID) | ORAL | Status: DC | PRN

## 2012-11-03 MED ORDER — LIDOCAINE HCL (CARDIAC) 20 MG/ML IV SOLN
INTRAVENOUS | Status: DC | PRN
  Administered 2012-11-03: 60 mg via INTRAVENOUS

## 2012-11-03 MED ORDER — INSULIN ASPART 100 UNIT/ML ~~LOC~~ SOLN
0.0000 [IU] | Freq: Three times a day (TID) | SUBCUTANEOUS | Status: DC
  Administered 2012-11-04: 1 [IU] via SUBCUTANEOUS

## 2012-11-03 MED ORDER — SODIUM CHLORIDE 0.9 % IR SOLN
Status: DC | PRN
  Administered 2012-11-03: 08:00:00

## 2012-11-03 MED ORDER — ZOLPIDEM TARTRATE 5 MG PO TABS: 5.0000 mg | ORAL_TABLET | Freq: Every evening | ORAL | Status: DC | PRN

## 2012-11-03 MED ORDER — RENA-VITE PO TABS
1.0000 | ORAL_TABLET | Freq: Every day | ORAL | Status: DC
  Administered 2012-11-03: 1 via ORAL
  Filled 2012-11-03 (×2): qty 1

## 2012-11-03 MED ORDER — HEPARIN SODIUM (PORCINE) 1000 UNIT/ML IJ SOLN
INTRAMUSCULAR | Status: AC
  Filled 2012-11-03: qty 1

## 2012-11-03 MED ORDER — AMITRIPTYLINE HCL 100 MG PO TABS
100.0000 mg | ORAL_TABLET | Freq: Every day | ORAL | Status: DC
  Administered 2012-11-03: 100 mg via ORAL
  Filled 2012-11-03 (×2): qty 1

## 2012-11-03 MED ORDER — SODIUM CHLORIDE 0.9 % IJ SOLN
3.0000 mL | Freq: Two times a day (BID) | INTRAMUSCULAR | Status: DC
  Administered 2012-11-03 – 2012-11-04 (×3): 3 mL via INTRAVENOUS

## 2012-11-03 SURGICAL SUPPLY — 60 items
ADH SKN CLS APL DERMABOND .7 (GAUZE/BANDAGES/DRESSINGS)
ADH SKN CLS LQ APL DERMABOND (GAUZE/BANDAGES/DRESSINGS) ×6
BANDAGE ELASTIC 4 VELCRO ST LF (GAUZE/BANDAGES/DRESSINGS) ×1 IMPLANT
BANDAGE GAUZE ELAST BULKY 4 IN (GAUZE/BANDAGES/DRESSINGS) ×1 IMPLANT
CANISTER SUCTION 2500CC (MISCELLANEOUS) ×3 IMPLANT
CATH CANNON HEMO 15FR 23CM (HEMODIALYSIS SUPPLIES) ×1 IMPLANT
CLIP TI MEDIUM 6 (CLIP) ×3 IMPLANT
CLIP TI WIDE RED SMALL 6 (CLIP) ×3 IMPLANT
CLOTH BEACON ORANGE TIMEOUT ST (SAFETY) ×3 IMPLANT
COVER SURGICAL LIGHT HANDLE (MISCELLANEOUS) ×3 IMPLANT
COVER TRANSDUCER ULTRASND GEL (DRAPE) ×1 IMPLANT
DECANTER SPIKE VIAL GLASS SM (MISCELLANEOUS) IMPLANT
DERMABOND ADHESIVE PROPEN (GAUZE/BANDAGES/DRESSINGS) ×3
DERMABOND ADVANCED (GAUZE/BANDAGES/DRESSINGS)
DERMABOND ADVANCED .7 DNX12 (GAUZE/BANDAGES/DRESSINGS) IMPLANT
DERMABOND ADVANCED .7 DNX6 (GAUZE/BANDAGES/DRESSINGS) IMPLANT
DRAPE C-ARM 42X72 X-RAY (DRAPES) ×1 IMPLANT
DRAPE CHEST BREAST 15X10 FENES (DRAPES) ×1 IMPLANT
DRSG PAD ABDOMINAL 8X10 ST (GAUZE/BANDAGES/DRESSINGS) ×1 IMPLANT
ELECT REM PT RETURN 9FT ADLT (ELECTROSURGICAL) ×3
ELECTRODE REM PT RTRN 9FT ADLT (ELECTROSURGICAL) ×2 IMPLANT
GAUZE SPONGE 2X2 8PLY STRL LF (GAUZE/BANDAGES/DRESSINGS) IMPLANT
GAUZE SPONGE 4X4 16PLY XRAY LF (GAUZE/BANDAGES/DRESSINGS) ×2 IMPLANT
GEL ULTRASOUND 20GR AQUASONIC (MISCELLANEOUS) IMPLANT
GLOVE BIO SURGEON STRL SZ 6.5 (GLOVE) ×1 IMPLANT
GLOVE BIO SURGEON STRL SZ7.5 (GLOVE) ×3 IMPLANT
GLOVE BIO SURGEON STRL SZ8 (GLOVE) ×1 IMPLANT
GLOVE BIOGEL PI IND STRL 6.5 (GLOVE) IMPLANT
GLOVE BIOGEL PI IND STRL 7.0 (GLOVE) IMPLANT
GLOVE BIOGEL PI IND STRL 7.5 (GLOVE) IMPLANT
GLOVE BIOGEL PI INDICATOR 6.5 (GLOVE) ×1
GLOVE BIOGEL PI INDICATOR 7.0 (GLOVE) ×5
GLOVE BIOGEL PI INDICATOR 7.5 (GLOVE) ×2
GLOVE ECLIPSE 6.5 STRL STRAW (GLOVE) ×1 IMPLANT
GLOVE SURG SS PI 6.5 STRL IVOR (GLOVE) ×2 IMPLANT
GLOVE SURG SS PI 7.5 STRL IVOR (GLOVE) ×3 IMPLANT
GOWN PREVENTION PLUS XLARGE (GOWN DISPOSABLE) ×6 IMPLANT
GOWN STRL NON-REIN LRG LVL3 (GOWN DISPOSABLE) ×8 IMPLANT
KIT BASIN OR (CUSTOM PROCEDURE TRAY) ×3 IMPLANT
KIT ROOM TURNOVER OR (KITS) ×3 IMPLANT
NEEDLE 22X1 1/2 (OR ONLY) (NEEDLE) ×1 IMPLANT
NS IRRIG 1000ML POUR BTL (IV SOLUTION) ×3 IMPLANT
PACK CV ACCESS (CUSTOM PROCEDURE TRAY) ×3 IMPLANT
PAD ARMBOARD 7.5X6 YLW CONV (MISCELLANEOUS) ×6 IMPLANT
SPONGE GAUZE 2X2 STER 10/PKG (GAUZE/BANDAGES/DRESSINGS) ×1
SPONGE LAP 18X18 X RAY DECT (DISPOSABLE) ×1 IMPLANT
SPONGE SURGIFOAM ABS GEL 100 (HEMOSTASIS) IMPLANT
SUT ETHILON 4 0 PS 2 18 (SUTURE) ×1 IMPLANT
SUT PROLENE 6 0 CC (SUTURE) ×3 IMPLANT
SUT VIC AB 3-0 SH 27 (SUTURE) ×9
SUT VIC AB 3-0 SH 27X BRD (SUTURE) ×2 IMPLANT
SUT VICRYL 4-0 PS2 18IN ABS (SUTURE) ×6 IMPLANT
SWAB COLLECTION DEVICE MRSA (MISCELLANEOUS) ×1 IMPLANT
SYR 20CC LL (SYRINGE) ×1 IMPLANT
SYR 5ML LL (SYRINGE) ×2 IMPLANT
TAPE CLOTH SURG 4X10 WHT LF (GAUZE/BANDAGES/DRESSINGS) ×1 IMPLANT
TOWEL OR 17X24 6PK STRL BLUE (TOWEL DISPOSABLE) ×3 IMPLANT
TOWEL OR 17X26 10 PK STRL BLUE (TOWEL DISPOSABLE) ×4 IMPLANT
UNDERPAD 30X30 INCONTINENT (UNDERPADS AND DIAPERS) ×3 IMPLANT
WATER STERILE IRR 1000ML POUR (IV SOLUTION) ×3 IMPLANT

## 2012-11-03 NOTE — Progress Notes (Signed)
Attempted to fax dialysis access record 3 times to [facility].  Busy.  Fax unsuccessful.

## 2012-11-03 NOTE — Anesthesia Preprocedure Evaluation (Addendum)
Anesthesia Evaluation  Patient identified by MRN, date of birth, ID band Patient awake    Reviewed: Allergy & Precautions, H&P , NPO status , Patient's Chart, lab work & pertinent test results  Airway Mallampati: II TM Distance: >3 FB Neck ROM: full    Dental  (+) Edentulous Upper and Dental Advisory Given   Pulmonary          Cardiovascular hypertension, +CHF + dysrhythmias Atrial Fibrillation     Neuro/Psych Depression  Neuromuscular disease    GI/Hepatic GERD-  ,  Endo/Other  diabetes, Type 2Adrenal insufficiency  Renal/GU ESRFRenal disease     Musculoskeletal  (+) Arthritis -,   Abdominal   Peds  Hematology   Anesthesia Other Findings   Reproductive/Obstetrics                          Anesthesia Physical Anesthesia Plan  ASA: III  Anesthesia Plan: General   Post-op Pain Management:    Induction: Intravenous  Airway Management Planned: LMA  Additional Equipment:   Intra-op Plan:   Post-operative Plan:   Informed Consent: I have reviewed the patients History and Physical, chart, labs and discussed the procedure including the risks, benefits and alternatives for the proposed anesthesia with the patient or authorized representative who has indicated his/her understanding and acceptance.     Plan Discussed with: CRNA and Surgeon  Anesthesia Plan Comments:         Anesthesia Quick Evaluation

## 2012-11-03 NOTE — Anesthesia Postprocedure Evaluation (Signed)
Anesthesia Post Note  Patient: [name]  Procedure(s) Performed: Procedure(s) (LRB): REMOVAL OF ARTERIOVENOUS GORETEX GRAFT  (Left) INSERTION OF DIALYSIS CATHETER (Left)  Anesthesia type: General  Patient location: PACU  Post pain: Pain level controlled and Adequate analgesia  Post assessment: Post-op Vital signs reviewed, Patient's Cardiovascular Status Stable, Respiratory Function Stable, Patent Airway and Pain level controlled  Last Vitals:  Filed Vitals:   11/03/12 1200  BP:   Pulse: 81  Temp:   Resp: 18    Post vital signs: Reviewed and stable  Level of consciousness: awake, alert  and oriented  Complications: No apparent anesthesia complications

## 2012-11-03 NOTE — Progress Notes (Signed)
Pt admitted to the unit from pre op after having an graft removed and diatek catheter placed. Pt has an compression wrap on her left arm and a gauze on her left chest and neck. Pt complain of pain 8 out of 10. Pt alert and oriented x4. Ambulatory with stand by assist. No skin break down noted. Some discoloration on bil lower ext's. Pt is from home with her niece who is at the bedside. Pt oriented to the unit and staff. Will cont to monitor.

## 2012-11-03 NOTE — Telephone Encounter (Addendum)
11/03/12- spoke with pts niece Cala Bradford to schedule follow up. Also mailed letter to pts home address, [place]   Message copied by Fredrich Birks on Tue Nov 03, 2012 10:24 AM ------      Message from: Marlowe Shores      Created: Tue Nov 03, 2012 10:06 AM       2 week F/U removal graft Darrick Penna, MD ------

## 2012-11-03 NOTE — Op Note (Signed)
Procedure: Removal of left forearm AV Graft Preop/Postop diagnosis: Infected graft Anesthesia: General Asst:Regina Roczniak, PAC  Findings: well incorporated graft, vein patch left brachial artery   Procedure details: After obtaining informed consent, the patient was taken to the operating room.  After induction of general anesthesia, the entire left upper extremity was prepped and draped in usual sterile fashion. A transverse incision was made through a pre-existing scar near a recent revision area in the forearm.  There was exposed graft in this location and it was thought to be a defunctional graft.  However after some manipulation this was found to be the current patent forearm graft.  There was some yellowish purulent material surrounding this and it was cultured.  It was decided at this point to remove the entire graft. An additional incision was made in the left midbrachial area over the venous limb of the graft.  The incision was carried into the subcutaneous tissues down to level the graft. The venous limb of the graft was dissected free circumferentially. These were mobilized as completely as possible down to level of the arterial anastomosis.  The full mobilization of the graft required two additional incisions in the forearm. The arterial limb the graft and dissected free circumferentially and doubly ligated proximally and then divided.  A small piece of vein was dissected free circumferentially above the level of the venous anastomosis and the vein was clamped and transected.  The distal end was ligated with a 2 0 silk.  The proximal piece of vein was opened longitudinally and placed in heparinized saline to use as a vein patch.  The patient was given 5000 units of heparin and after 2 minutes of circulation time the artery was controlled proximally and distally with vessel loops.  The remainder of PTFE was removed from the artery and a vein patch angioplasty of the brachial artery was performed  with the previously harvested vein.  At completion this was forebled backbled and thoroughly flushed.  The anastomosis was secured and there was good pulsatile flow in the artery immediately.  The was good doppler ulnar and radial flow.  After the graft had been totally removed, all wounds were thoroughly irrigated with normal saline solution. Subcutaneous tissues of the antecubital and distal forearm incision closed with a running 3-0 Vicryl suture. The skin of all incisions was closed with a running 4 0 vicryl subcuticular stitch.  The pt had audible radial doppler and ulnar at the end of the case.  A dry sterile dressing and dermabond was applied to the incisions.  At this point preparations were made to place a diatek catheter.   The patient's entire neck and chest were prepped and draped in usual sterile fashion. The patient was placed in Trendelenburg position. Ultrasound was used to identify the patient's left internal jugular vein. This had normal compressibility and respiratory variation. The right internal jugular vein was occluded. Using ultrasound guidance, the left internal jugular vein was successfully cannulated.  A 0.035 J-tipped guidewire was threaded into the left internal jugular vein and into the superior vena cava followed by the inferior vena cava under fluoroscopic guidance.   Next sequential 12 and 14 dilators were placed over the guidewire into the right atrium.  A 16 French dilator with a peel-away sheath was then placed over the guidewire into the right atrium.   The guidewire and dilator were removed. A 23 cm Diatek catheter was then placed through the peel away sheath into the right atrium.  The catheter  was then tunneled subcutaneously, cut to length, and the hub attached. The catheter was noted to flush and draw easily. The catheter was inspected under fluoroscopy and found with its tip to be in the right atrium without any kinks throughout its course. The catheter was sutured to the  skin with nylon sutures. The neck insertion site was closed with Vicryl stitch. The catheter was then loaded with concentrated Heparin solution. A dry sterile dressing was applied.  The patient tolerated procedure well and there were no complications. Instrument sponge and needle counts correct in the case. The patient was taken to the recovery room in stable condition. Chest x-ray will be obtained in the recovery room.  Fabienne Bruns, MD [facility] [place] Office: [phone] Pager: [phone]

## 2012-11-03 NOTE — Anesthesia Procedure Notes (Signed)
Procedure Name: LMA Insertion Date/Time: 11/03/2012 7:41 AM Performed by: Jefm Miles E Pre-anesthesia Checklist: Patient identified, Timeout performed, Emergency Drugs available, Suction available and Patient being monitored Patient Re-evaluated:Patient Re-evaluated prior to inductionOxygen Delivery Method: Circle system utilized Preoxygenation: Pre-oxygenation with 100% oxygen Intubation Type: IV induction Ventilation: Mask ventilation without difficulty LMA: LMA inserted LMA Size: 4.0 Number of attempts: 1 Placement Confirmation: positive ETCO2 and breath sounds checked- equal and bilateral Tube secured with: Tape Dental Injury: Teeth and Oropharynx as per pre-operative assessment

## 2012-11-03 NOTE — Progress Notes (Signed)
Spoke with Dr Darrick Penna concerning patient's pain and request to be admitted over night. Dr Darrick Penna will arrange for admittance to  [id number] .

## 2012-11-03 NOTE — Progress Notes (Signed)
PORTABLE CHEST - 1 VIEW  Comparison: 08/25/2012  Findings: Double-lumen dialysis catheter has been inserted and the  tips are in the right atrium in good position. Mild chronic  cardiomegaly. Prominent main pulmonary arteries with slight  pulmonary vascular congestion. No pneumothorax.  IMPRESSION: Satisfactory appearance of the chest after hemodialysis  catheter insertion. Slight pulmonary vascular congestion.  Original Report Authenticated By: Francene Boyers, M.D.

## 2012-11-03 NOTE — Preoperative (Signed)
Beta Blockers   Reason not to administer Beta Blockers:Not Applicable, Coreg on 11/03/12 at 0500

## 2012-11-03 NOTE — OR Nursing (Signed)
Procedure 1 Arteriovenous graft removal start at 800 end at 1002. Procedure 2 dialysis catheter start at 1020 end at 1040.

## 2012-11-03 NOTE — H&P (View-Only) (Signed)
Patient is an 79-year-old female status post revision of a left forearm graft approximately 3 weeks ago. She had replacement of the radial segment for degeneration. She apparently had some erythema and drainage postoperatively. She is currently on IV antibiotics with dialysis. She was very upset during her office visit today as her son passed away recently. She denies any fever or chills.  Physical exam:  Filed Vitals:   10/29/12 1329  BP: 162/90  Pulse: 82  Resp: 18  Height: 5' 2" (1.575 m)  Weight: 135 lb 14.4 oz (61.644 kg)   Left upper extremity: Incisions on the lateral aspect of her forearm graft are healing there is a small amount of serous drainage from the corner of one incision on the radial limb. There is no surrounding graft erythema. There is now a segment of exposed defunctionalized graft.  There is no graft tenderness. 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   Assessment/Plan chronic sinus tract with exposed old nonfunctional graft. The new segment of graft is at risk for infection I believe this old segment needs to be removed.  We have scheduled this for Tuesday, February 25  Charles Fields, MD  [facility] [place]  Office: [phone]  Pager: [phone]  

## 2012-11-03 NOTE — Interval H&P Note (Signed)
History and Physical Interval Note:  11/03/2012 7:19 AM  [name]  has presented today for surgery, with the diagnosis of Arteriovenous graft infection  The various methods of treatment have been discussed with the patient and family. After consideration of risks, benefits and other options for treatment, the patient has consented to  Procedure(s): REMOVAL OF ARTERIOVENOUS GORETEX GRAFT (AVGG) (Left) as a surgical intervention .  The patient's history has been reviewed, patient examined, no change in status, stable for surgery.  I have reviewed the patient's chart and labs.  Questions were answered to the patient's satisfaction.     Marleni Gallardo E

## 2012-11-03 NOTE — Transfer of Care (Signed)
Immediate Anesthesia Transfer of Care Note  Patient: [name]  Procedure(s) Performed: Procedure(s) with comments: REMOVAL OF ARTERIOVENOUS GORETEX GRAFT  (Left) INSERTION OF DIALYSIS CATHETER (Left) - 23cm catheter inserted in left IJ  Patient Location: PACU  Anesthesia Type:General  Level of Consciousness: awake and oriented  Airway & Oxygen Therapy: Patient Spontanous Breathing and Patient connected to face mask oxygen  Post-op Assessment: Report given to PACU RN  Post vital signs: Reviewed and stable  Complications: No apparent anesthesia complications

## 2012-11-04 LAB — GLUCOSE, CAPILLARY

## 2012-11-04 LAB — CBC
Hemoglobin: 9.4 g/dL — ABNORMAL LOW (ref 12.0–15.0)
MCH: 31.8 pg (ref 26.0–34.0)
MCV: 96.3 fL (ref 78.0–100.0)
RBC: 2.96 MIL/uL — ABNORMAL LOW (ref 3.87–5.11)
WBC: 6.3 10*3/uL (ref 4.0–10.5)

## 2012-11-04 MED ORDER — TRAMADOL HCL 50 MG PO TABS: 50.0000 mg | ORAL_TABLET | Freq: Two times a day (BID) | ORAL | Status: DC | PRN

## 2012-11-04 MED ORDER — TRAMADOL HCL 50 MG PO TABS
50.0000 mg | ORAL_TABLET | Freq: Two times a day (BID) | ORAL | Status: DC | PRN
  Filled 2012-11-04: qty 1

## 2012-11-04 MED FILL — Mupirocin Oint 2%: CUTANEOUS | Qty: 22 | Status: AC

## 2012-11-04 NOTE — Progress Notes (Signed)
Patient discharged home with niece. Patient was discharged with prescriptions and instructions. Upon discharge patient stated that she was not going to go to outpatient dialysis today because she didn't feel up to it. . Dr Darrick Penna was notified and he came to patients bedside to discuss this issue with her. Patient stated she was going to dialysis today. Patient was told of signs and symptoms of infection to look for in her arm. Patient was stable upon discharge.

## 2012-11-04 NOTE — Progress Notes (Addendum)
  VASCULAR AND VEIN SURGERY PROGRESS NOTE  POST-OP HEMODIALYSIS ACCESS  Date of Surgery: 11/03/2012 Surgeon: Surgeon(s): Sherren Kerns, MD 1 Day Post-Op Left Procedure(s): REMOVAL OF ARTERIOVENOUS GORETEX GRAFT  INSERTION OF DIALYSIS CATHETER   HPI: [name] is a 79 y.o. female who is 1 Day Post-Op removal of left upper extremity Hemodialysis access. The patient denies symptoms of numbness, tingling, weakness; complains of pain in the operative limb.   Significant Diagnostic Studies: CBC Lab Results  Component Value Date   WBC 6.3 11/04/2012   HGB 9.4* 11/04/2012   HCT 28.5* 11/04/2012   MCV 96.3 11/04/2012   PLT 166 11/04/2012    BMET    Component Value Date/Time   NA 135 11/03/2012 0706   K 4.8 11/03/2012 0706   CL 96 03/20/2010 0832   CO2 28 03/20/2010 0832   GLUCOSE 114* 11/03/2012 0706   BUN 46* 03/20/2010 0832   CREATININE 3.13* 03/20/2010 0832   CALCIUM 8.3* 03/20/2010 0832   GFRNONAA 14* 03/20/2010 0832   GFRAA  Value: 17        The eGFR has been calculated using the MDRD equation. This calculation has not been validated in all clinical situations. eGFR's persistently <60 mL/min signify possible Chronic Kidney Disease.* 03/20/2010 1610    COAG No results found for this basename: INR, PROTIME   No results found for this basename: PTT    Vital Signs  BP Readings from Last 3 Encounters:  11/04/12 111/92  11/04/12 111/92  10/29/12 162/90   Temp Readings from Last 3 Encounters:  11/04/12 98.3 F (36.8 C) Oral  11/04/12 98.3 F (36.8 C) Oral  09/10/12 97.5 F (36.4 C) Oral   SpO2 Readings from Last 3 Encounters:  11/04/12 92%  11/04/12 92%  10/08/12 100%   Pulse Readings from Last 3 Encounters:  11/04/12 76  11/04/12 76  10/29/12 82     Physical Examination  left upper Incision is clean, dry, intact, skin color is normal , hand grip is 5/5, sensation in digits is intact;  The dressing is intact and not tight. Left hand is  warm  [name] is a 79 y.o. year old female who presents s/p removal of left upper extremity Hemodialysis access. Follow-up in 4 weeks with Dr. Darrick Penna  The patient will dialyze through her L IJ catheter  Spoke with Charge nurse at [facility]- pt may Dialyze this afternoon by 2pm. Niece needs to call them when she is bringing her. [phone]  Remove dressing in 24 hours [name] 11/04/2012 7:56 AM   History and exam details as above No hematoma in arm 2+ radial pulse Dialysis later today in [place] Comber, MD [facility] [place] Office: [phone] Pager: [phone]

## 2012-11-04 NOTE — Discharge Summary (Signed)
[facility] Discharge Summary   Patient ID:  [name] MRN: [id number] DOB/AGE: Sep 30, 1929 79 y.o.  Admit date: 11/03/2012 Discharge date: 11/04/2012 Date of Surgery: 11/03/2012 - 11/04/2012 Surgeon: Surgeon(s): Sherren Kerns, MD  Admission Diagnosis: GRAFT INFECTION  Discharge Diagnoses:  GRAFT INFECTION  Secondary Diagnoses: Past Medical History  Diagnosis Date  . Diabetes mellitus   . Anemia   . History of chronic CHF   . Hyperlipidemia   . Adrenal insufficiency   . Pediculosis   . Hyperparathyroidism, secondary   . CHF (congestive heart failure)   . Cancer     sun cancer  . Hypertension     "does not have primary physician", sees dialysis Dr. Caryn Section  . Atrial fibrillation     "does not have cardiologist"  . Depression   . Pneumonia     hx of  . ESRD (end stage renal disease)     Mon, Wed, [place], [place]  . GERD (gastroesophageal reflux disease)   . Neuromuscular disorder     diabetic neuropathy  . Arthritis   . Arteriovenous graft infection     "received IV antibiotics x 3-4 after each administration of dialysis"    Procedure(s): REMOVAL OF ARTERIOVENOUS GORETEX GRAFT  INSERTION OF DIALYSIS CATHETER  Discharged Condition: good  HPI:  [name] is a 79 y.o. female who is status post revision of a left forearm graft approximately 3 weeks ago. She had replacement of the radial segment for degeneration. She apparently had some erythema and drainage postoperatively. She is currently on IV antibiotics with dialysis. She was very upset during her office visit today as her son passed away recently. She denies any fever or chills. chronic sinus tract with exposed old nonfunctional graft. The new segment of graft is at risk for infection I believe this old segment needs to be removed. We have scheduled this for Tuesday, February 25  Hospital Course:  [name] is a 79 y.o. female is S/P Left Procedure(s): REMOVAL OF  ARTERIOVENOUS GORETEX GRAFT  INSERTION OF DIALYSIS CATHETER Extubated: POD # 0 Post-op wounds clean, dry, intact Pt. Ambulating, voiding and taking PO diet without difficulty. Pt pain controlled with PO pain meds. Labs as below Complications:none  Consults:     Significant Diagnostic Studies: CBC Lab Results  Component Value Date   WBC 6.3 11/04/2012   HGB 9.4* 11/04/2012   HCT 28.5* 11/04/2012   MCV 96.3 11/04/2012   PLT 166 11/04/2012    BMET    Component Value Date/Time   NA 135 11/03/2012 0706   K 4.8 11/03/2012 0706   CL 96 03/20/2010 0832   CO2 28 03/20/2010 0832   GLUCOSE 114* 11/03/2012 0706   BUN 46* 03/20/2010 0832   CREATININE 3.13* 03/20/2010 0832   CALCIUM 8.3* 03/20/2010 0832   GFRNONAA 14* 03/20/2010 0832   GFRAA  Value: 17        The eGFR has been calculated using the MDRD equation. This calculation has not been validated in all clinical situations. eGFR's persistently <60 mL/min signify possible Chronic Kidney Disease.* 03/20/2010 4098   COAG No results found for this basename: INR, PROTIME     Disposition:  Discharge to :Home Discharge Orders   Future Appointments Provider Department Dept Phone   11/26/2012 1:45 PM Sherren Kerns, MD [facility] [phone]   Future Orders Complete By Expires     Call MD for:  redness, tenderness, or signs of infection (pain,  swelling, bleeding, redness, odor or green/yellow discharge around incision site)  As directed     Call MD for:  severe or increased pain, loss or decreased feeling  in affected limb(s)  As directed     Call MD for:  temperature >100.5  As directed     Driving Restrictions  As directed     Comments:      No driving    Increase activity slowly  As directed     Comments:      Walk with assistance use walker or cane as needed    Remove dressing in 48 hours  As directed     Scheduling Instructions:      Or if hand becomes swollen, numb or painful    Resume previous diet   As directed         Medication List    TAKE these medications       AMBIEN 10 MG tablet  Generic drug:  zolpidem  Take 10 mg by mouth at bedtime as needed.     amitriptyline 100 MG tablet  Commonly known as:  ELAVIL  Take 100 mg by mouth at bedtime.     calcium acetate 667 MG capsule  Commonly known as:  PHOSLO  Take 667-1,334 mg by mouth 3 (three) times daily with meals.     carvedilol 12.5 MG tablet  Commonly known as:  COREG  Take 12.5 mg by mouth 2 (two) times daily with a meal.     celecoxib 200 MG capsule  Commonly known as:  CELEBREX  Take 200 mg by mouth daily.     fish oil-omega-3 fatty acids 1000 MG capsule  Take 2 g by mouth daily.     fosinopril 20 MG tablet  Commonly known as:  MONOPRIL  Take 20 mg by mouth daily.     HUMULIN [place]  Inject 2-8 Units into the skin 4 (four) times daily -  before meals and at bedtime. Sliding scale     HYDROcodone-acetaminophen 7.5-325 MG per tablet  Commonly known as:  NORCO  Take 1 tablet by mouth every 6 (six) hours as needed for pain. For pain     multivitamin Tabs tablet  Take 1 tablet by mouth daily.     omeprazole 20 MG capsule  Commonly known as:  PRILOSEC  Take 20 mg by mouth daily.     sertraline 50 MG tablet  Commonly known as:  ZOLOFT  Take 50 mg by mouth daily.     simvastatin 40 MG tablet  Commonly known as:  ZOCOR  Take 40 mg by mouth every evening.     vitamin B-12 1000 MCG tablet  Commonly known as:  CYANOCOBALAMIN  Take 1,000 mcg by mouth daily.     Vitamin C & E Combination 500-400 MG-UNIT Caps  Take 1 capsule by mouth daily.       Verbal and written Discharge instructions given to the patient. Wound care per Discharge AVS     Follow-up Information   Follow up with Sherren Kerns, MD In 2 weeks. (office will arrange)    Contact information:   [address] [place] [address] [phone]      She will go to her dialysis center today to be dialyzed. Spoke with [facility]  11/03/12 to arrange  Signed: Marlowe Shores 11/04/2012, 12:49 PM

## 2012-11-05 ENCOUNTER — Encounter (HOSPITAL_COMMUNITY): Payer: Self-pay | Admitting: Vascular Surgery

## 2012-11-06 LAB — WOUND CULTURE: Gram Stain: NONE SEEN

## 2012-11-25 ENCOUNTER — Encounter: Payer: Self-pay | Admitting: Vascular Surgery

## 2012-11-26 ENCOUNTER — Ambulatory Visit (INDEPENDENT_AMBULATORY_CARE_PROVIDER_SITE_OTHER): Payer: Medicare Other | Admitting: Vascular Surgery

## 2012-11-26 ENCOUNTER — Encounter: Payer: Self-pay | Admitting: Vascular Surgery

## 2012-11-26 VITALS — BP 175/95 | HR 88 | Ht 62.0 in | Wt 131.6 lb

## 2012-11-26 DIAGNOSIS — N186 End stage renal disease: Secondary | ICD-10-CM

## 2012-11-26 NOTE — Progress Notes (Addendum)
  VASCULAR AND VEIN SURGERY PROGRESS NOTE   Date of Surgery:GRAFT INFECTION removal 11-03-2012  Surgeon: Sherren Kerns, MD      EXB:[id number] [name] is a 79 y.o. female who is status post removal of a left forearm graft approximately 3 weeks ago.  She is here for a 3 week follow up visit.  She states her left arm is sore, but getting better.    Vital Signs  BP Readings from Last 3 Encounters:  11/26/12 175/95  11/04/12 135/64  11/04/12 135/64   Temp Readings from Last 3 Encounters:  11/04/12 98.7 F (37.1 C) Oral  11/04/12 98.7 F (37.1 C) Oral  09/10/12 97.5 F (36.4 C) Oral   SpO2 Readings from Last 3 Encounters:  11/26/12 100%  11/04/12 94%  11/04/12 94%   Pulse Readings from Last 3 Encounters:  11/26/12 88  11/04/12 81  11/04/12 81     Physical Examination  left upper Incision is healing well, skin color is normal, no cyanosis, jaundice, pallor or bruising, normal , hand grip is 5/5, sensation in digits is intact;  She has palpable femoral pulses and radial pulse on the left.    Assessment/Plan -S/P removal of left arm infected AV graft. -Dialysis access is via left IJ diatek. -The patient is not interested in further surgery right now. -She will follow up PRN when she is ready to pursue possible left arm access verses thigh access. -She will need new vein mapping at her next appoint,ent if she wishes to pursue new access.  Clinton Gallant [facility] 11/26/2012 2:20 PM   Agree with above.  Pt currently not interested in further access procedures.  If she changes her mind will need vein map of right arm or consider thigh graft if she refuses right arm access  Fabienne Bruns, MD [facility] [place] Office: [phone] Pager: [phone]

## 2013-01-15 ENCOUNTER — Other Ambulatory Visit: Payer: Self-pay | Admitting: Vascular Surgery

## 2013-02-11 ENCOUNTER — Ambulatory Visit: Payer: Medicare Other | Admitting: Vascular Surgery

## 2013-02-14 IMAGING — CR DG CHEST 2V
2 series · 2 of 2 positions shown · non-contrast
Comparison: [DATE]

CLINICAL DATA: Preop for left arm fistula/graft repair.

CHEST - 2 VIEW

[w chest pa]
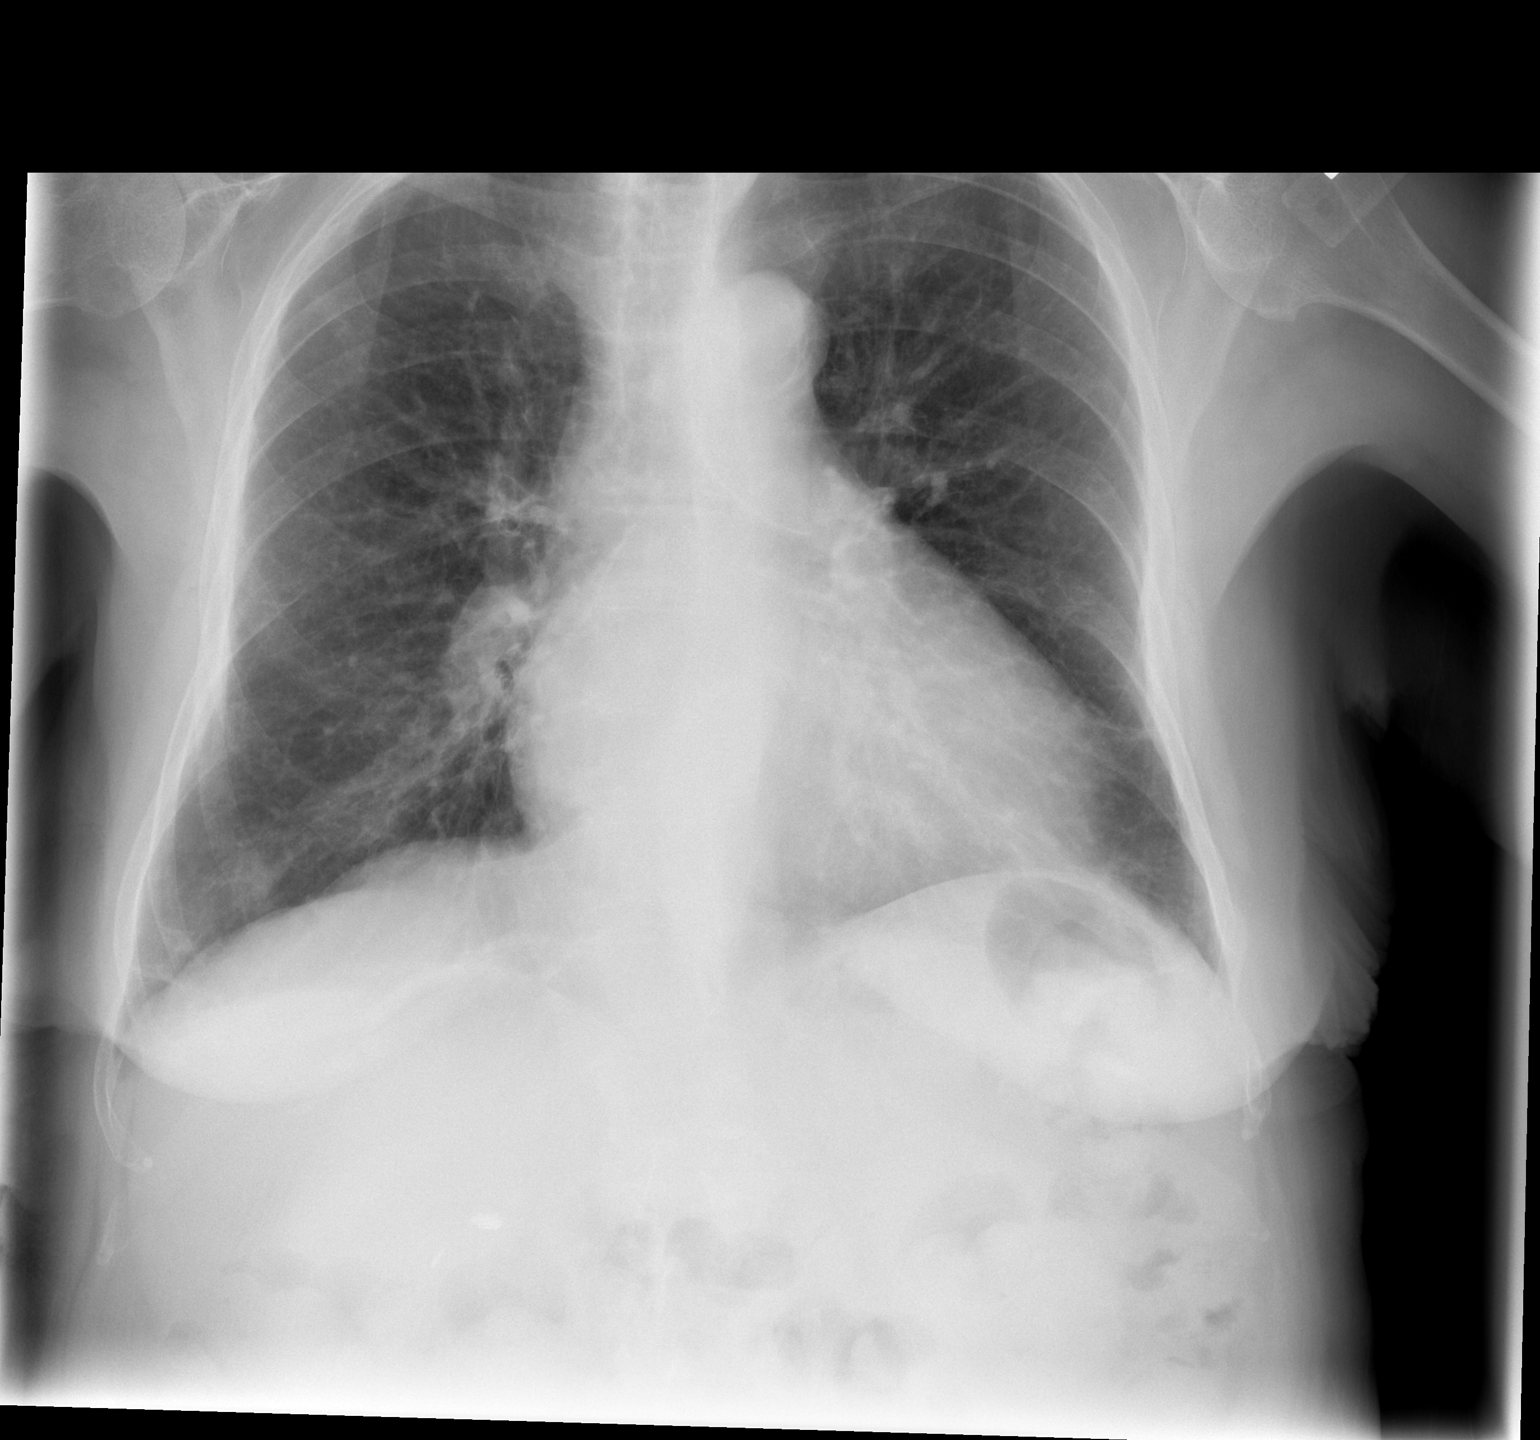

[w chest lat]
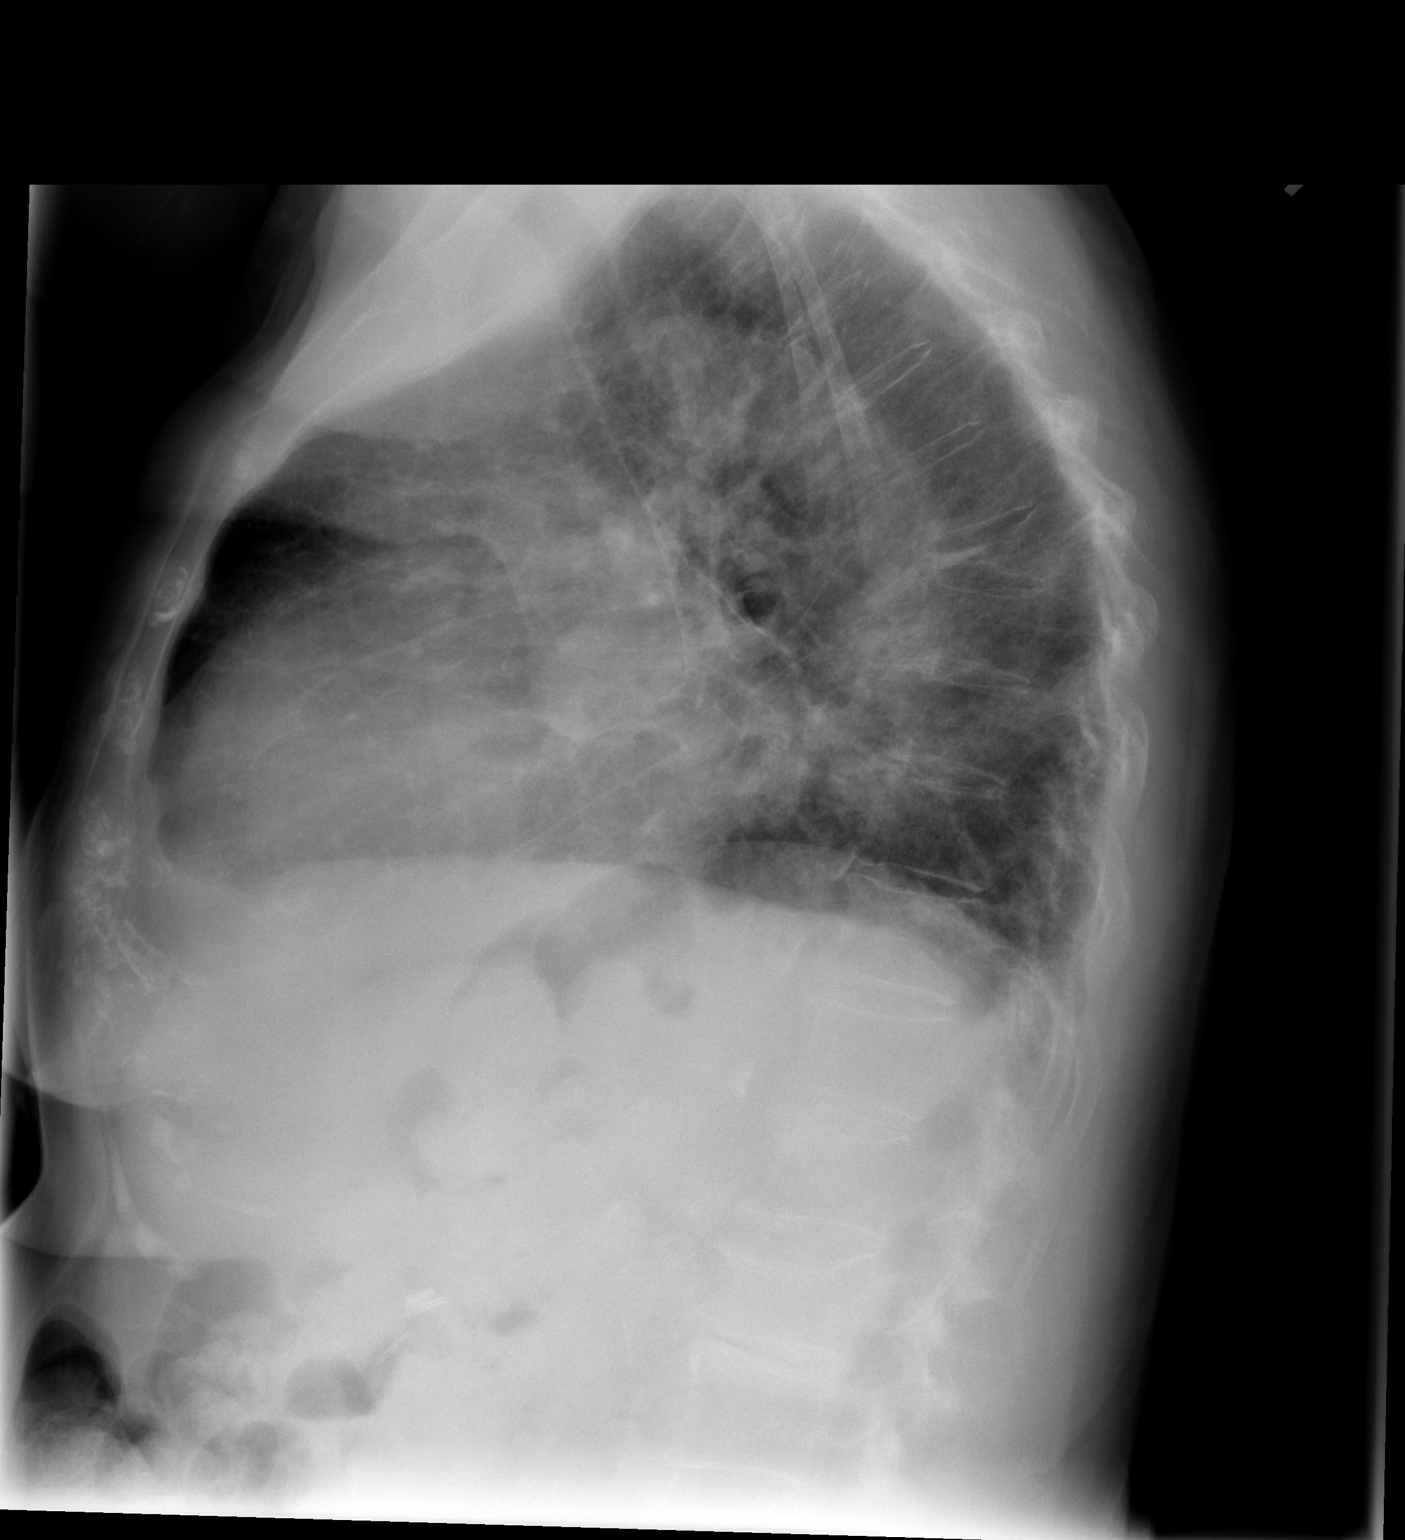

[2 of 2 positions shown; findings below may reference images not displayed]

FINDINGS: The cardiac silhouette is mildly enlarged.  No
mediastinal or hilar masses or adenopathy are noted.  There are
some reticular opacities in the lung bases likely subsegmental
atelectasis, scarring or a combination.  The lungs are mildly
hyperexpanded but otherwise clear with no edema or infiltrate.  The
bony thorax is demineralized but intact.
IMPRESSION: No acute cardiopulmonary disease.

## 2013-04-25 IMAGING — CR DG CHEST 1V PORT
1 series · 1 of 1 positions shown · non-contrast
Comparison: 08/25/2012

CLINICAL DATA: Renal failure.  New insertion of hemodialysis
catheter.

PORTABLE CHEST - 1 VIEW

[AP]
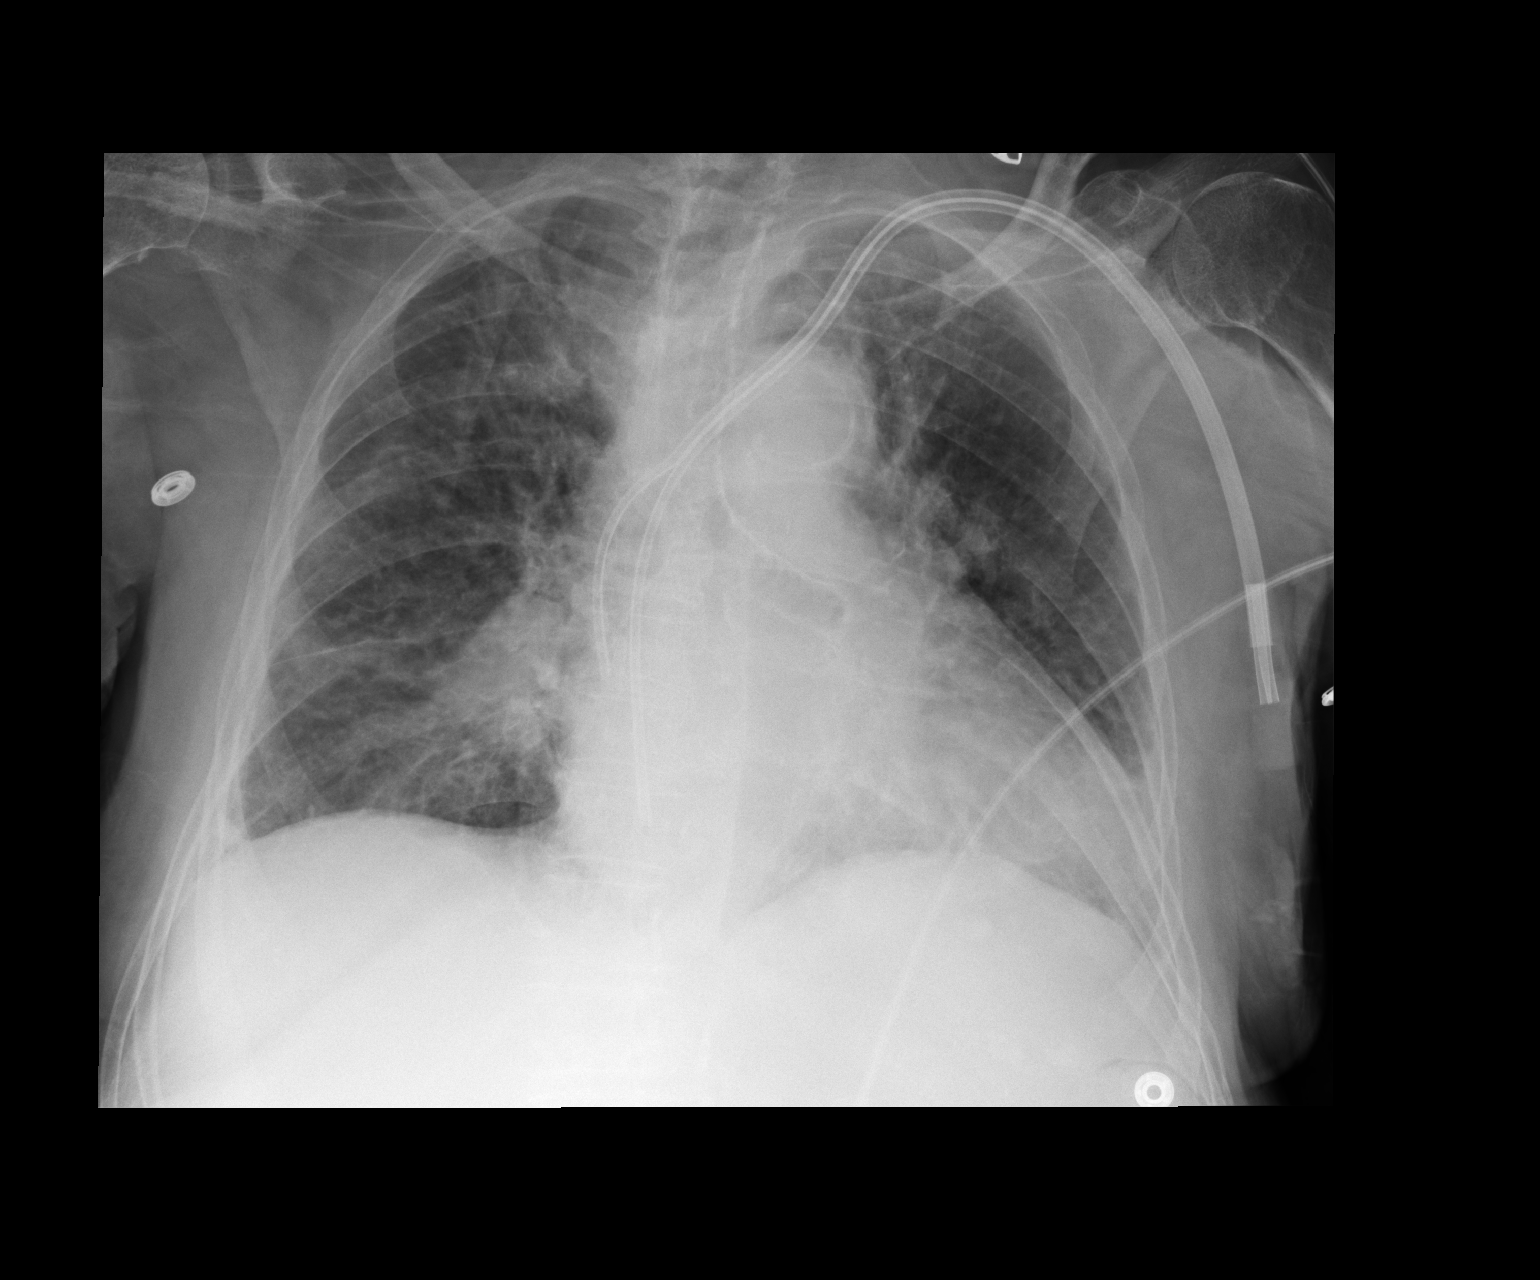

[1 of 1 positions shown; findings below may reference images not displayed]

FINDINGS: Double-lumen dialysis catheter has been inserted and the
tips are in the right atrium in good position.  Mild chronic
cardiomegaly.  Prominent main pulmonary arteries with slight
pulmonary vascular congestion.  No pneumothorax.
IMPRESSION: Satisfactory appearance of the chest after hemodialysis
catheter insertion.  Slight pulmonary vascular congestion.

## 2013-07-08 ENCOUNTER — Encounter (INDEPENDENT_AMBULATORY_CARE_PROVIDER_SITE_OTHER): Payer: Self-pay

## 2013-07-08 ENCOUNTER — Ambulatory Visit (INDEPENDENT_AMBULATORY_CARE_PROVIDER_SITE_OTHER): Payer: Medicare Other

## 2013-07-08 VITALS — BP 120/65 | HR 77 | Resp 16

## 2013-07-08 DIAGNOSIS — L608 Other nail disorders: Secondary | ICD-10-CM

## 2013-07-08 DIAGNOSIS — E1142 Type 2 diabetes mellitus with diabetic polyneuropathy: Secondary | ICD-10-CM

## 2013-07-08 DIAGNOSIS — E1140 Type 2 diabetes mellitus with diabetic neuropathy, unspecified: Secondary | ICD-10-CM

## 2013-07-08 DIAGNOSIS — E1149 Type 2 diabetes mellitus with other diabetic neurological complication: Secondary | ICD-10-CM

## 2013-07-08 DIAGNOSIS — Q828 Other specified congenital malformations of skin: Secondary | ICD-10-CM

## 2013-07-08 NOTE — Patient Instructions (Signed)

## 2013-07-08 NOTE — Progress Notes (Signed)
  Subjective:    Patient ID: [name], female    DOB: 1930/03/19, 79 y.o.   MRN: [id number]  HPI trim nails and check my feet No other changes medication her health history. Patient does have some keratoses distal clavus second digits bilateral to digital contractures.   Review of Systems  HENT: Negative.   Eyes: Negative.   Respiratory: Negative.   Cardiovascular: Negative.   Gastrointestinal: Negative.   Endocrine:       Excessive thirst  Genitourinary: Positive for difficulty urinating.  Musculoskeletal:       Joint pain   Neurological: Positive for weakness.       Objective:   Physical Exam Vascular status as follows has absent PT pulse bilateral. Intact dorsalis pedis pulse one over 4 bilateral. Epicritic and proprioceptive sensations diminished on Semmes Weinstein testing to the forefoot and plantar digits bilateral DTRs not elicited bilateral. Nails thick brittle crumbly with discoloration and friability. There is no active ulceration no secondary infection at present time. Should note patient is a digital contractures hammertoe deformities diabetes and complicated factors are noted       Assessment & Plan:  Diabetes with peripheral neuropathy, thick dystrophic probably orthotic nails are debrided as well as multiple keratoses distal clavus second bilateral and pinch callus first right are debrided and the presence of diabetes and neuropathy return for future palliative care is needed contact us is any changes or exacerbations in the interim. Maintain accommodative shoes at all times.  Alvan Dame DPM

## 2013-10-07 ENCOUNTER — Ambulatory Visit: Payer: Medicare Other

## 2013-10-19 ENCOUNTER — Encounter: Payer: Self-pay | Admitting: Podiatrist

## 2013-10-19 ENCOUNTER — Ambulatory Visit (INDEPENDENT_AMBULATORY_CARE_PROVIDER_SITE_OTHER): Payer: Medicare Other | Admitting: Podiatrist

## 2013-10-19 VITALS — BP 170/93 | HR 89 | Resp 18

## 2013-10-19 DIAGNOSIS — M109 Gout, unspecified: Secondary | ICD-10-CM

## 2013-10-19 DIAGNOSIS — L02619 Cutaneous abscess of unspecified foot: Secondary | ICD-10-CM

## 2013-10-19 DIAGNOSIS — L03039 Cellulitis of unspecified toe: Secondary | ICD-10-CM

## 2013-10-19 NOTE — Patient Instructions (Signed)

## 2013-10-19 NOTE — Progress Notes (Signed)
Subjective:  Patient presents today for a concern over her left 2nd toe.  She states "Dr Idolina Primer sent me over here due to my red toe and started this past week and my toes are numb and makes my foot hurt and I have a corn on my 3rd toe on right foot"  She is in dialysis and relates she all of a sudden developed a red and swollen left 2nd toe.  She denies any injury to the toe.  Denies any drainage or injury to the toenail itself.  She relates she is already on an antibiotic for the toe that she is taking twice daily.    Objective:  Vascular status reveals 1/4 dp and 1/4 pt pulses at today's visit. Sensation is diminished protectively as per previous exam to the forefoot and plantar digits bilateral.  She has digital contractures and associated hyperkeratotic lesions present at the tip of her 3rd toe of the right foot that is bothersome.  No redness or swelling in the 3rd toe is noted.  No interdigital maceration is present, no break in the skin on or around the 2nd toe is seen, no problem with the toenail itself is seen.  No sign of injury or bruising noted.    Assessment:  Gout vs. cellulitus left 2nd toe.  Hyperkeratotic lesion tip of 3rd toe.    Plan:  She is instructed to finish out the antibiotic.  I injected marcaine and 4mg  of dexamethasone into the base of the 2nd toe for the inflammation and likelyhood that she is having a gout attack in her 2nd toe.  I will see her back in 1 week for recheck and if the toe gets increasingly more red or swollen she is instructed to call.  She is given elevation and moist heat instructions as well.

## 2013-10-21 ENCOUNTER — Ambulatory Visit: Payer: Medicare Other

## 2013-10-26 ENCOUNTER — Ambulatory Visit: Payer: Medicare Other | Admitting: Podiatrist

## 2014-01-06 ENCOUNTER — Ambulatory Visit: Payer: Medicare Other

## 2015-04-10 DEATH — deceased
